# Patient Record
Sex: Female | Born: 1995 | Race: Black or African American | Hispanic: No | Marital: Single | Smoking: Never smoker
Health system: Southern US, Community
[De-identification: ages and names within clinical notes are randomized; demographics above are authoritative.]

## PROBLEM LIST (undated history)

## (undated) ENCOUNTER — Encounter

## (undated) ENCOUNTER — Ambulatory Visit

## (undated) ENCOUNTER — Ambulatory Visit: Payer: MEDICAID | Attending: Dermatology | Primary: Dermatology

## (undated) ENCOUNTER — Telehealth: Attending: Dermatology | Primary: Dermatology

## (undated) ENCOUNTER — Encounter: Attending: Dermatology | Primary: Dermatology

## (undated) ENCOUNTER — Ambulatory Visit: Payer: MEDICAID

## (undated) ENCOUNTER — Telehealth

## (undated) ENCOUNTER — Ambulatory Visit (HOSPITAL_COMMUNITY): Admission: EM

## (undated) ENCOUNTER — Inpatient Hospital Stay (HOSPITAL_COMMUNITY): Payer: Self-pay

## (undated) DIAGNOSIS — T7840XA Allergy, unspecified, initial encounter: Secondary | ICD-10-CM

## (undated) DIAGNOSIS — L309 Dermatitis, unspecified: Secondary | ICD-10-CM

## (undated) DIAGNOSIS — L2084 Intrinsic (allergic) eczema: Secondary | ICD-10-CM

## (undated) HISTORY — DX: Intrinsic (allergic) eczema: L20.84

## (undated) HISTORY — DX: Allergy, unspecified, initial encounter: T78.40XA

## (undated) HISTORY — PX: NO PAST SURGERIES: SHX2092

## (undated) MED ORDER — DUPIXENT 300 MG/2 ML SUBCUTANEOUS PEN INJECTOR: mL | 2 refills | 0 days

---
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
## 2015-06-02 ENCOUNTER — Encounter: Payer: Self-pay | Admitting: *Deleted

## 2015-06-02 ENCOUNTER — Encounter (INDEPENDENT_AMBULATORY_CARE_PROVIDER_SITE_OTHER): Payer: Self-pay

## 2015-06-02 ENCOUNTER — Ambulatory Visit (INDEPENDENT_AMBULATORY_CARE_PROVIDER_SITE_OTHER): Payer: No Typology Code available for payment source | Admitting: *Deleted

## 2015-06-02 VITALS — Ht 61.5 in | Wt 92.8 lb

## 2015-06-02 DIAGNOSIS — N946 Dysmenorrhea, unspecified: Secondary | ICD-10-CM

## 2015-06-02 DIAGNOSIS — L309 Dermatitis, unspecified: Secondary | ICD-10-CM

## 2015-06-02 DIAGNOSIS — Z68.41 Body mass index (BMI) pediatric, less than 5th percentile for age: Secondary | ICD-10-CM

## 2015-06-02 DIAGNOSIS — Z3040 Encounter for surveillance of contraceptives, unspecified: Secondary | ICD-10-CM | POA: Diagnosis not present

## 2015-06-02 DIAGNOSIS — Z3049 Encounter for surveillance of other contraceptives: Secondary | ICD-10-CM

## 2015-06-02 DIAGNOSIS — Z3042 Encounter for surveillance of injectable contraceptive: Secondary | ICD-10-CM

## 2015-06-02 LAB — CBC WITH DIFFERENTIAL/PLATELET
BASOS ABS: 0 10*3/uL (ref 0.0–0.1)
Basophils Relative: 0 % (ref 0–1)
EOS ABS: 0.7 10*3/uL (ref 0.0–0.7)
EOS PCT: 8 % — AB (ref 0–5)
HCT: 33.6 % — ABNORMAL LOW (ref 36.0–46.0)
HEMOGLOBIN: 10.4 g/dL — AB (ref 12.0–15.0)
LYMPHS ABS: 1.6 10*3/uL (ref 0.7–4.0)
Lymphocytes Relative: 17 % (ref 12–46)
MCH: 21.7 pg — ABNORMAL LOW (ref 26.0–34.0)
MCHC: 31 g/dL (ref 30.0–36.0)
MCV: 70.1 fL — AB (ref 78.0–100.0)
MONO ABS: 0.7 10*3/uL (ref 0.1–1.0)
MONOS PCT: 8 % (ref 3–12)
MPV: 9.6 fL (ref 8.6–12.4)
Neutro Abs: 6.2 10*3/uL (ref 1.7–7.7)
Neutrophils Relative %: 67 % (ref 43–77)
PLATELETS: 327 10*3/uL (ref 150–400)
RBC: 4.79 MIL/uL (ref 3.87–5.11)
RDW: 16.6 % — AB (ref 11.5–15.5)
WBC: 9.3 10*3/uL (ref 4.0–10.5)

## 2015-06-02 LAB — POCT URINE PREGNANCY: PREG TEST UR: NEGATIVE

## 2015-06-02 MED ORDER — CLOBETASOL PROPIONATE 0.05 % EX OINT: 1.0000 "application " | TOPICAL_OINTMENT | Freq: Two times a day (BID) | CUTANEOUS | Status: DC

## 2015-06-02 MED ORDER — TRIAMCINOLONE ACETONIDE 0.1 % EX OINT: 1.0000 "application " | TOPICAL_OINTMENT | Freq: Two times a day (BID) | CUTANEOUS | Status: DC

## 2015-06-02 MED ORDER — MEDROXYPROGESTERONE ACETATE 150 MG/ML IM SUSP
150.0000 mg | Freq: Once | INTRAMUSCULAR | Status: AC
  Administered 2015-06-02: 150 mg via INTRAMUSCULAR

## 2015-06-02 NOTE — Progress Notes (Signed)
History was provided by the patient and mother.  Kimberly Odom is a 19 y.o. female who is here for follow up.    HPI:   1.Severe eczema Right now using hydrocortisone cream, eucerin cream, dove soap. Mother reports that symptoms worsen in the summer time. Family was referred to dermatology last visit (one year prior to presentation) and did not attend appointment. Mother reports that lesions are itchy and Kimberly Odom states that she has been scratching her back most prominently. She has not tried bleach baths in the past. She denies open lesions, active drainage or bleeding.  Itchy lesions. Mother reports frustration that only if reminded does [name] apply lotion/ topical steroid. She states that [name] does not like being "greasy."  2. Irregular menstrual cycle/ Menorrhagia / Contraceptive management:  Mother reports that cycles are "out of control". Cycles occur monthly (at different times during the month). Cycles are 8 days in duration. She reports significant pain during the first 2 days. She endorses painful cramps that limit activity. She uses "super" tampons and completely saturates tampon within 2-3 hours of application most days of her cycle. Cycle lightens during last day per [name]. Mother was called from school because she refused to come out of the bathroom due to pain. She also had an episode of fainting at a department store while on her period. She denies changes in heart rate or chest pain during this episode.   Started depot at last appointment ( 1 year prior to presentation). Mother reports 2 administration of depot shot since that time. She is not currently using any regimen for contraception management. Mother and Kimberly Odom are interested in long-term management as OCP's were not effective. Mother wonders about nexplanon, but is not interested in intrauterine device. Per mom, it was difficult to keep up when the next shot was due.   3. Weight Weight has stablized at the  appointment. Mom reports that weight is always challenging during eczema flares. Eating 3 meals daily, fried chicken, balanced meals. Fruits/ veggies. Never got prescription for pediasure, mom is interested in this. Mom is not very interested in meeting with nutritionist.    4. Mood [name] reports that mood is stable. Mother has no concerns. Kimberly Odom denies frequent crying, difficulty sleeping. She will enter 10th grade this year. Reports that grades are "okay" and needs to work on [place].   Physical Exam:  Ht 5' 1.5" (1.562 m)  Wt 92 lb 12.8 oz (42.094 kg)  BMI 17.25 kg/m2  LMP 06/02/2015  No blood pressure reading on file for this encounter. Patient's last menstrual period was 06/02/2015.  General:   alert and cooperative. Thin adolescent female. Sitting upright on examination table. Quiet and soft spoken throughout the encounter. In no acute distress.   Skin:   Severe ecezma diffusely to skin. Some areas of thickened plaques most prominent to dorsum of feet bilaterally. No active superimposed infection or drainage from lesions.   Oral cavity:   lips, mucosa, and tongue normal; teeth and gums normal  Eyes:   sclerae white, pupils equal and reactive, red reflex normal bilaterally  Ears:   normal bilaterally  Nose: clear, no discharge  Neck:  Neck appearance: Normal  Lungs:  clear to auscultation bilaterally  Heart:   regular rate and rhythm, S1, S2 normal, no murmur, click, rub or gallop   Abdomen:  soft, non-tender; bowel sounds normal; no masses,  no organomegaly  GU:  normal female  Extremities:   extremities normal, atraumatic, no cyanosis or edema  Neuro:  normal without focal findings and mental status, speech normal, alert and oriented    Assessment/Plan: 1. Severe eczema Emphasized importance of daily maintenance care for management of eczema. Recommended calendar to remind Kimberly Odom to administered lotion and ointment daily. Mother interested in referral to Dermatology, but  did not attend next year. Eczema flares during the summer time. Will write for clobetasol for acute flare, counseled to use within the next 5 days. Recommended transition to daily triamcinolone afterwards.  - Ambulatory referral to Dermatology - clobetasol ointment (TEMOVATE) 0.05 %; Apply 1 application topically 2 (two) times daily. For very severe eczema.  Do not use for more than 1 week at a time.  Dispense: 60 g; Refill: 1 - triamcinolone ointment (KENALOG) 0.1 %; Apply 1 application topically 2 (two) times daily.  Dispense: 453.6 g; Refill: 4  2. Dysmenorrhea in adolescent Will obtain CBC with differential for assessment of Hgb status in setting of dysmenorrhea/ menorrhagia. Will write for Depo injection at this visit as detailed below. If patient with further episodes of fainting can consider cardiology work up.  - CBC with Differential/Platelet  3. Encounter for surveillance of contraceptives, Depo contraception Counseled mother and patient that Depo contraception often requires more than two administrations prior to regulation of cycle and improvement in menorrhagia. Counseled that Depo would be ideal contraception management due to promotion of weight gain. Will obtain U-preg and if negative, administer Depo-provera at this appointment. If depot does not improve symptoms after taking consistently, counseled family to consider nexplanon.  - POCT urine pregnancy-- negative  - medroxyPROGESTERone (DEPO-PROVERA) injection 150 mg; Inject 1 mL (150 mg total) into the muscle once.  4. BMI (body mass index), pediatric, less than 5th percentile for age -Amb referral to nutrition. Counseled regarding eating healthy nutritious meals.   5. Transition of care:  Patient now 19 years of age, will likely require transition to adult provider in upcoming year. Patient remains in highschool secondary to school failure.   Mom expressed understanding and agreement with plan.   - Follow-up visit in 3 months  for [facility] and depo administration, or sooner as needed.    Elige Radon, MD [facility] Pediatric Primary Care PGY-2 06/02/2015

## 2015-06-02 NOTE — Patient Instructions (Signed)
MAKE AN ALARM ON YOUR PHONE FOR THE NEXT DEPOT SHOT (Birth control shot)  APPOINTMENT.     Basic Skin Care Your child's skin plays an important role in keeping the entire body healthy.  Below are some tips on how to try and maximize skin health from the outside in.  1) Bathe in mildly warm water every 1 to 3 days, followed by light drying and an application of a thick moisturizer cream or ointment, preferably one that comes in a tub. a. Fragrance free moisturizing bars or body washes are preferred such as Purpose, Cetaphil, Dove sensitive skin, Aveeno, [employer] or Vanicream products. b. Use a fragrance free cream or ointment, not a lotion, such as plain petroleum jelly or Vaseline ointment, Aquaphor, Vanicream, Eucerin cream or a generic version, CeraVe Cream, Cetaphil Restoraderm, Aveeno Eczema Therapy and [employer], among others. c. Children with very dry skin often need to put on these creams two, three or four times a day.  As much as possible, use these creams enough to keep the skin from looking dry. d. Consider using fragrance free/dye free detergent, such as Arm and Hammer for sensitive skin, Tide Free or All Free.   2) If I am prescribing a medication to go on the skin, the medicine goes on first to the areas that need it, followed by a thick cream as above to the entire body.  3) Wynelle Link is a major cause of damage to the skin. a. I recommend sun protection for all of my patients. I prefer physical barriers such as hats with wide brims that cover the ears, long sleeve clothing with SPF protection including rash guards for swimming. These can be found seasonally at outdoor clothing companies, [employer] and [employer] and online at Liz Cla[employer]orne.com, www.uvskinz.com and BrideEmporium.nl. Avoid peak sun between the hours of 10am to 3pm to minimize sun exposure.  b. I recommend sunscreen for all of my patients older than 40 months of age when in the sun, preferably with broad  spectrum coverage and SPF 30 or higher.  i. For children, I recommend sunscreens that only contain titanium dioxide and/or zinc oxide in the active ingredients. These do not burn the eyes and appear to be safer than chemical sunscreens. These sunscreens include zinc oxide paste found in the diaper section, Vanicream Broad Spectrum 50+, Aveeno Natural Mineral Protection, Neutrogena Pure and Free Baby, [employer] Daily face and body lotion, [employer], among others. ii. There is no such thing as waterproof sunscreen. All sunscreens should be reapplied after 60-80 minutes of wear.  iii. Spray on sunscreens often use chemical sunscreens which do protect against the sun. However, these can be difficult to apply correctly, especially if wind is present, and can be more likely to irritate the skin.  Long term effects of chemical sunscreens are also not fully known.

## 2015-06-02 NOTE — Progress Notes (Signed)
I saw and evaluated the patient, performing the key elements of the service. I developed the management plan that is described in the resident's note, and I agree with the content.  Crystle Carelli                  06/02/2015, 3:01 PM

## 2015-06-03 ENCOUNTER — Encounter: Payer: Self-pay | Admitting: Pediatrics

## 2015-06-03 DIAGNOSIS — D509 Iron deficiency anemia, unspecified: Secondary | ICD-10-CM | POA: Insufficient documentation

## 2015-06-05 ENCOUNTER — Telehealth: Payer: Self-pay

## 2015-06-05 NOTE — Telephone Encounter (Signed)
-----   Message from Theadore Nan, MD sent at 06/03/2015  9:39 AM EDT ----- Please let mom know that she is a little anemic and should be taking a multivitamin with iron. We can recheck her level at her next visit for Depo in October

## 2015-06-05 NOTE — Telephone Encounter (Signed)
Message given to mother, no further questions.

## 2015-08-29 ENCOUNTER — Ambulatory Visit: Payer: No Typology Code available for payment source | Admitting: Pediatrics

## 2015-09-01 ENCOUNTER — Ambulatory Visit (INDEPENDENT_AMBULATORY_CARE_PROVIDER_SITE_OTHER): Payer: No Typology Code available for payment source | Admitting: Pediatrics

## 2015-09-01 ENCOUNTER — Encounter: Payer: Self-pay | Admitting: Pediatrics

## 2015-09-01 VITALS — Wt 95.0 lb

## 2015-09-01 DIAGNOSIS — L309 Dermatitis, unspecified: Secondary | ICD-10-CM | POA: Diagnosis not present

## 2015-09-01 DIAGNOSIS — Z3042 Encounter for surveillance of injectable contraceptive: Secondary | ICD-10-CM | POA: Diagnosis not present

## 2015-09-01 DIAGNOSIS — N926 Irregular menstruation, unspecified: Secondary | ICD-10-CM | POA: Diagnosis not present

## 2015-09-01 DIAGNOSIS — Z23 Encounter for immunization: Secondary | ICD-10-CM | POA: Diagnosis not present

## 2015-09-01 MED ORDER — CLOBETASOL PROPIONATE 0.05 % EX OINT: 1.0000 "application " | TOPICAL_OINTMENT | Freq: Two times a day (BID) | CUTANEOUS | Status: DC

## 2015-09-01 MED ORDER — TRIAMCINOLONE ACETONIDE 0.1 % EX OINT: 1.0000 "application " | TOPICAL_OINTMENT | Freq: Two times a day (BID) | CUTANEOUS | Status: DC

## 2015-09-01 MED ORDER — MEDROXYPROGESTERONE ACETATE 150 MG/ML IM SUSP
150.0000 mg | Freq: Once | INTRAMUSCULAR | Status: AC
  Administered 2015-09-01: 150 mg via INTRAMUSCULAR

## 2015-09-01 NOTE — Progress Notes (Signed)
   Subjective:     Kimberly Odom, is a 19 y.o. female  HPI  Depo--first was 06/02/15 For heavy and painful bleeding  Currently having menses and this is second time since depo  Now 2 1/2 weeks: now spotting for a couple days, when first started bleeding was lighter than before, no pian so far.  Diet: not milk every day, no vitamins.  Atopic Derm Has appt on 09/07/15: for first visit with derm Skin is not so good right now because ran out of refills Clobetasol--on bad area Still has big tub of triamcinalone Needs refills Dove every two day, vaseline twice a day  Twice     Review of Systems  Sister recently had heart transplant.   The following portions of the patient's history were reviewed and updated as appropriate: allergies, current medications, past family history, past medical history, past social history, past surgical history and problem list.     Objective:     Physical Exam  Skin: hyperpigmentation and dry everywhere, but less thick plaques than last visit. No scab no open areas.     Assessment & Plan:   1. Irregular menstrual cycle Reviewed calcium and vit D needed (and iron) Improved pain and decreased flow. Reassured than flow and pain will continue to decrease as uses depo longer.  - medroxyPROGESTERone (DEPO-PROVERA) injection 150 mg; Inject 1 mL (150 mg total) into the muscle once.  2. Severe eczema All areas involved, but better than last visit, reviewed care and refills ordered.   - clobetasol ointment (TEMOVATE) 0.05 %; Apply 1 application topically 2 (two) times daily. For very severe eczema.  Do not use for more than 1 week at a time.  Dispense: 60 g; Refill: 1 - triamcinolone ointment (KENALOG) 0.1 %; Apply 1 application topically 2 (two) times daily.  Dispense: 453.6 g; Refill: 4  3. Need for vaccination  - Flu Vaccine QUAD 36+ mos IM  Spent 15 minutes face to face time with patient; greater than 50% spent in counseling regarding  diagnosis and treatment plan.   Theadore NanMCCORMICK, Rishita Petron, MD

## 2015-09-01 NOTE — Patient Instructions (Signed)
Calcium:  Needs between 800 and 1500 mg of calcium a day with Vitamin D Try:  Viactiv two a day Or extra strength Tums 500 mg twice a day Or orange juice with calcium.  Calcium Carbonate 500 mg  Twice a day   Also need Vitamin D

## 2015-11-17 ENCOUNTER — Ambulatory Visit: Payer: No Typology Code available for payment source | Admitting: *Deleted

## 2015-11-22 ENCOUNTER — Ambulatory Visit: Payer: Self-pay | Admitting: *Deleted

## 2015-12-12 ENCOUNTER — Ambulatory Visit (INDEPENDENT_AMBULATORY_CARE_PROVIDER_SITE_OTHER): Payer: Self-pay | Admitting: *Deleted

## 2015-12-12 DIAGNOSIS — Z3042 Encounter for surveillance of injectable contraceptive: Secondary | ICD-10-CM

## 2015-12-12 DIAGNOSIS — Z3202 Encounter for pregnancy test, result negative: Secondary | ICD-10-CM

## 2015-12-12 DIAGNOSIS — Z3049 Encounter for surveillance of other contraceptives: Secondary | ICD-10-CM

## 2015-12-12 MED ORDER — MEDROXYPROGESTERONE ACETATE 150 MG/ML IM SUSP
150.0000 mg | Freq: Once | INTRAMUSCULAR | Status: AC
  Administered 2015-12-12: 150 mg via INTRAMUSCULAR

## 2016-02-22 ENCOUNTER — Other Ambulatory Visit: Payer: Self-pay | Admitting: Pediatrics

## 2016-02-22 ENCOUNTER — Telehealth: Payer: Self-pay | Admitting: *Deleted

## 2016-02-22 DIAGNOSIS — L309 Dermatitis, unspecified: Secondary | ICD-10-CM

## 2016-02-22 MED ORDER — CLOBETASOL PROPIONATE 0.05 % EX OINT: 1.0000 "application " | TOPICAL_OINTMENT | Freq: Two times a day (BID) | CUTANEOUS | Status: DC

## 2016-02-22 NOTE — Telephone Encounter (Signed)
done

## 2016-02-22 NOTE — Telephone Encounter (Signed)
Called mom and let her know

## 2016-02-22 NOTE — Telephone Encounter (Signed)
Mom requesting a refill on Clobetasol ointment.

## 2016-02-27 ENCOUNTER — Ambulatory Visit (INDEPENDENT_AMBULATORY_CARE_PROVIDER_SITE_OTHER): Payer: Self-pay | Admitting: *Deleted

## 2016-02-27 DIAGNOSIS — Z3049 Encounter for surveillance of other contraceptives: Secondary | ICD-10-CM

## 2016-02-27 DIAGNOSIS — Z3042 Encounter for surveillance of injectable contraceptive: Secondary | ICD-10-CM

## 2016-02-27 MED ORDER — MEDROXYPROGESTERONE ACETATE 150 MG/ML IM SUSP
150.0000 mg | Freq: Once | INTRAMUSCULAR | Status: AC
  Administered 2016-02-27: 150 mg via INTRAMUSCULAR

## 2016-02-27 NOTE — Progress Notes (Signed)
Pt presents for depo injection. Pt within depo window, no urine hcg needed. Injection given, tolerated well. F/u depo injection visit scheduled.   

## 2016-02-28 ENCOUNTER — Telehealth: Payer: Self-pay | Admitting: Pediatrics

## 2016-02-28 NOTE — Telephone Encounter (Signed)
Stopped by mother yesterday evening as I was walking out of clinic. Mother states her daughter was seen and asked for a prescription for her eczema cream.  Mother states daughter was told she could not get that prescription at the visit.  Mother states patient's medicaid expired and she is in the process of renewing it.  Her eczema cream costs $400 without insurance.  Therefore, mother would like alternative cream to use while the paperwork for medicaid reapplication is processed.  Advised mother that clinic was closed by that I would share the message with her PCP, Dr. Kathlene NovemberMcCormick.  Of note, it appears patient was in for Depoprovera only visit when requesting eczema cream.

## 2016-03-12 ENCOUNTER — Encounter: Payer: Self-pay | Admitting: Pediatrics

## 2016-03-12 ENCOUNTER — Ambulatory Visit (INDEPENDENT_AMBULATORY_CARE_PROVIDER_SITE_OTHER): Payer: Self-pay | Admitting: Clinical

## 2016-03-12 ENCOUNTER — Ambulatory Visit (INDEPENDENT_AMBULATORY_CARE_PROVIDER_SITE_OTHER): Payer: Self-pay | Admitting: Pediatrics

## 2016-03-12 VITALS — BP 102/64 | Ht 61.22 in | Wt 95.1 lb

## 2016-03-12 DIAGNOSIS — Z113 Encounter for screening for infections with a predominantly sexual mode of transmission: Secondary | ICD-10-CM

## 2016-03-12 DIAGNOSIS — R4183 Borderline intellectual functioning: Secondary | ICD-10-CM

## 2016-03-12 DIAGNOSIS — L309 Dermatitis, unspecified: Secondary | ICD-10-CM

## 2016-03-12 DIAGNOSIS — R69 Illness, unspecified: Secondary | ICD-10-CM

## 2016-03-12 MED ORDER — TRIAMCINOLONE ACETONIDE 0.5 % EX OINT: 1.0000 "application " | TOPICAL_OINTMENT | Freq: Two times a day (BID) | CUTANEOUS | Status: DC

## 2016-03-12 MED ORDER — TRIAMCINOLONE ACETONIDE 0.1 % EX OINT: 1.0000 "application " | TOPICAL_OINTMENT | Freq: Two times a day (BID) | CUTANEOUS | Status: DC

## 2016-03-12 NOTE — Progress Notes (Signed)
   Subjective:     Kimberly Odom, is a 20 y.o. female  No chief complaint on file.    HPI  Mom is very worried about her future.   11 th grade, should graduate at 20 year old  in December,  Really struggles with learning, basic knowledge, seems like a 20 year old  Had IEP, is capable of learning, borderline intellectual ability, low achievement Getting Ds IEP completed in 10 grade, not special E eligible, in regular classess  Struggle to interview for job,  Is scared to learn to drive,  Reads some but little comprehension of what read,   She took pictures of herself without clothes and sent on internet  Guardian none established  Just once in a while ensure, mostly eats good.   Skin: ran out of cream, no longer had medicaid, cost $400  Review of Systems    The following portions of the patient's history were reviewed and updated as appropriate: allergies, current medications, past family history, past medical history, past social history, past surgical history and problem list.     Objective:     There were no vitals taken for this visit.  Physical Exam  Constitutional: She appears well-developed.  HENT:  Mouth/Throat: Oropharynx is clear and moist.  Cardiovascular: Normal rate.   No murmur heard. Pulmonary/Chest: Effort normal and breath sounds normal.  Abdominal: Soft. She exhibits no distension. There is no tenderness.  Lymphadenopathy:    She has no cervical adenopathy.  Skin: Rash noted.  Extensive thick plaques on hands.        Assessment & Plan:   1. Borderline intellectual disability Needs Guardianship: Protecting her physically and  financially Supervised job setting,  Apply for [employer] does some life skills training ,  Medical transition to adult provider ,  Wilfred LacyJ Williams, also to see, will also preovide Legal aid references and women's center resource.   2. Severe eczema  Discussed 4$ prescriptions,   - triamcinolone ointment  (KENALOG) 0.5 %; Apply 1 application topically 2 (two) times daily.  Dispense: 45 g; Refill: 1 - triamcinolone ointment (KENALOG) 0.1 %; Apply 1 application topically 2 (two) times daily.  Dispense: 80 g; Refill: 4  3. Routine screening for STI (sexually transmitted infection)  - GC/Chlamydia Probe Amp  Patient and/or legal guardian verbally consented to meet with Behavioral Health Clinician about presenting concerns. Supportive care and return precautions reviewed.  Spent  25  minutes face to face time with patient; greater than 50% spent in counseling regarding diagnosis and treatment plan.   Theadore NanMCCORMICK, Kimberly Franta, MD

## 2016-03-13 LAB — GC/CHLAMYDIA PROBE AMP
CT Probe RNA: NOT DETECTED
GC Probe RNA: NOT DETECTED

## 2016-03-14 NOTE — BH Specialist Note (Signed)
VISIT DATE: 03/12/16 Referring Provider: Theadore NanMCCORMICK, HILARY, MD Session Time:  1215 - 1235 (20 minutes) Type of Service: Behavioral Health - Individual/Family Interpreter: No.  Interpreter Name & Language: N/A # [facility] July 2016-June 2017: 1ST  PRESENTING CONCERNS:  Matthias HughsQuesheba Goosby is a 20 y.o. female brought in by mother. Matthias HughsQuesheba Ambers [facility] concerns with behaviors and need for additional support system.  Mother concerned that Sharlyne CaiQuesheba is not able make healthy & appropriate choices due to her cognitive ability.   GOALS ADDRESSED:  Increase adequate support system to improve ability to make healthier choices.   INTERVENTIONS:  Introduced [facility] role within integrated care team Provided education on resources & support system in the community, including resources for guardianship as requested by mother.   ASSESSMENT/OUTCOME:  Sharlyne CaiQuesheba presented to be quiet through most of the visit but did respond to direct questions.  Sharlyne CaiQuesheba and her mother were given education and resources in the community, including opportunity for [employer] training and information about guardianship.   TREATMENT PLAN:  Mother to review information and follow up with community resources. Sharlyne CaiQuesheba to participate in decision making regarding her involvement with services in the community.   No follow up visit scheduled with this [facility] at this time.  This [facility] will be available as needed for additional support & resources as needed.  Jasmine P Bettey CostaWilliams LCSW Behavioral Health Clinician [facility]

## 2016-05-14 ENCOUNTER — Ambulatory Visit (INDEPENDENT_AMBULATORY_CARE_PROVIDER_SITE_OTHER): Payer: Self-pay | Admitting: *Deleted

## 2016-05-14 DIAGNOSIS — Z3049 Encounter for surveillance of other contraceptives: Secondary | ICD-10-CM

## 2016-05-14 DIAGNOSIS — Z3042 Encounter for surveillance of injectable contraceptive: Secondary | ICD-10-CM

## 2016-05-14 MED ORDER — MEDROXYPROGESTERONE ACETATE 150 MG/ML IM SUSP
150.0000 mg | Freq: Once | INTRAMUSCULAR | Status: AC
  Administered 2016-05-14: 150 mg via INTRAMUSCULAR

## 2016-05-14 NOTE — Progress Notes (Signed)
Pt presents for depo injection. Pt within depo window, no urine hcg needed. Injection given, tolerated well. F/u depo injection visit scheduled.   

## 2016-05-29 ENCOUNTER — Encounter: Payer: Self-pay | Admitting: Pediatrics

## 2016-05-30 ENCOUNTER — Encounter: Payer: Self-pay | Admitting: Pediatrics

## 2016-07-30 ENCOUNTER — Encounter: Payer: Self-pay | Admitting: *Deleted

## 2016-07-30 ENCOUNTER — Ambulatory Visit (INDEPENDENT_AMBULATORY_CARE_PROVIDER_SITE_OTHER): Payer: Self-pay | Admitting: *Deleted

## 2016-07-30 DIAGNOSIS — Z3042 Encounter for surveillance of injectable contraceptive: Secondary | ICD-10-CM

## 2016-07-30 DIAGNOSIS — Z3049 Encounter for surveillance of other contraceptives: Secondary | ICD-10-CM

## 2016-07-30 MED ORDER — MEDROXYPROGESTERONE ACETATE 150 MG/ML IM SUSP
150.0000 mg | Freq: Once | INTRAMUSCULAR | Status: AC
  Administered 2016-07-30: 150 mg via INTRAMUSCULAR

## 2016-07-30 NOTE — Progress Notes (Signed)
Pt presents for depo injection. Pt within depo window, no urine hcg needed. Injection given, tolerated well. F/u depo injection visit scheduled.   

## 2016-10-15 ENCOUNTER — Ambulatory Visit (INDEPENDENT_AMBULATORY_CARE_PROVIDER_SITE_OTHER): Payer: Self-pay | Admitting: *Deleted

## 2016-10-15 DIAGNOSIS — Z3042 Encounter for surveillance of injectable contraceptive: Secondary | ICD-10-CM

## 2016-10-15 MED ORDER — MEDROXYPROGESTERONE ACETATE 150 MG/ML IM SUSP
150.0000 mg | Freq: Once | INTRAMUSCULAR | Status: AC
  Administered 2016-10-15: 150 mg via INTRAMUSCULAR

## 2016-10-15 NOTE — Progress Notes (Signed)
Pt presents for depo injection. Pt within depo window, no urine hcg needed. Injection given, tolerated well. F/u depo injection visit scheduled.   

## 2016-12-31 ENCOUNTER — Ambulatory Visit (INDEPENDENT_AMBULATORY_CARE_PROVIDER_SITE_OTHER): Payer: Self-pay | Admitting: *Deleted

## 2016-12-31 DIAGNOSIS — Z3049 Encounter for surveillance of other contraceptives: Secondary | ICD-10-CM

## 2016-12-31 MED ORDER — MEDROXYPROGESTERONE ACETATE 150 MG/ML IM SUSP
150.0000 mg | Freq: Once | INTRAMUSCULAR | Status: AC
  Administered 2016-12-31: 150 mg via INTRAMUSCULAR

## 2016-12-31 NOTE — Progress Notes (Signed)
Patient within Depo window. Presents today for her depo shot. IM given in left deltoid tolerated well, and scheduled an appointment for May 15th for another Depo. Patient discharged.

## 2017-03-02 ENCOUNTER — Other Ambulatory Visit: Payer: Self-pay | Admitting: Pediatrics

## 2017-03-02 DIAGNOSIS — L309 Dermatitis, unspecified: Secondary | ICD-10-CM

## 2017-03-03 NOTE — Telephone Encounter (Signed)
Refill for Triamcinalone  ReceivedShe had not been seen for this problem for a year.    Please make an appointment for evaluation of eczema.  Kimberly Odom is almost 21 and will soon no longer be able to be seen in the Pediatric clinic.   I have heard that  [facility] is taking New Patients. Please request and return a new patient appt in order to schedule an appointment there if you would like.

## 2017-03-18 ENCOUNTER — Ambulatory Visit (INDEPENDENT_AMBULATORY_CARE_PROVIDER_SITE_OTHER): Payer: Self-pay | Admitting: *Deleted

## 2017-03-18 DIAGNOSIS — Z3042 Encounter for surveillance of injectable contraceptive: Secondary | ICD-10-CM

## 2017-03-18 DIAGNOSIS — Z113 Encounter for screening for infections with a predominantly sexual mode of transmission: Secondary | ICD-10-CM

## 2017-03-18 MED ORDER — MEDROXYPROGESTERONE ACETATE 150 MG/ML IM SUSP
150.0000 mg | Freq: Once | INTRAMUSCULAR | Status: AC
  Administered 2017-03-18: 150 mg via INTRAMUSCULAR

## 2017-03-18 NOTE — Progress Notes (Signed)
Patient within Depo window. Presents today for her depo shot. IM given in left deltoid tolerated well,  Pt stated that she has being bleeing on and off for the last month. Consulted with Alfonso Ramusaroline Hacker, Urine sample obtained for STI testing, advised pt to call us back to schedule appointment with provider if this continue to happen. Scheduled an appointment for July 30 th one day herlier per caorline's for another Depo. Patient discharged.

## 2017-03-19 LAB — GC/CHLAMYDIA PROBE AMP
CT PROBE, AMP APTIMA: NOT DETECTED
GC PROBE AMP APTIMA: NOT DETECTED

## 2017-03-25 NOTE — Progress Notes (Signed)
STI screen negative  Spoke to patient and she is aware of neg STI results.

## 2017-05-09 ENCOUNTER — Telehealth: Payer: Self-pay | Admitting: *Deleted

## 2017-05-09 NOTE — Telephone Encounter (Signed)
Mom called requesting refill for triamcinolone cream. Since patient has not been seen for this condition for over a year I attempted to call the family to schedule. Left voicemail asking them to call the clinic.

## 2017-05-14 NOTE — Telephone Encounter (Signed)
Agree with need to been seen. Is also past due for well care.  Kimberly Odom needs to transition to a medical provider for adults. Mom may have a preferred new clinic. [facility] often is taking new patients.

## 2017-06-02 ENCOUNTER — Ambulatory Visit (INDEPENDENT_AMBULATORY_CARE_PROVIDER_SITE_OTHER): Payer: Self-pay | Admitting: *Deleted

## 2017-06-02 DIAGNOSIS — Z3042 Encounter for surveillance of injectable contraceptive: Secondary | ICD-10-CM

## 2017-06-02 MED ORDER — MEDROXYPROGESTERONE ACETATE 150 MG/ML IM SUSP
150.0000 mg | Freq: Once | INTRAMUSCULAR | Status: AC
  Administered 2017-06-02: 150 mg via INTRAMUSCULAR

## 2017-06-02 NOTE — Addendum Note (Signed)
Addended by: Theadore NanMCCORMICK, Bronnie Vasseur on: 06/02/2017 11:02 AM   Modules accepted: Orders

## 2017-06-02 NOTE — Progress Notes (Signed)
Agree with planned assessment by adolescent team for IUD, will place order.  I also note that she is past due for periodic review of reporductive health issues with adolescent provider.

## 2017-06-02 NOTE — Progress Notes (Signed)
Patient within Depo window. Presents today for her depo shot. IM given in left deltoid tolerated well. Patient voiced and interest in IUD.  Will route this encounter to PCP for referral to Adolescent clinic for IUD.

## 2017-08-08 ENCOUNTER — Encounter: Payer: Self-pay | Admitting: Pediatrics

## 2017-08-22 ENCOUNTER — Ambulatory Visit: Payer: Self-pay | Admitting: Pediatrics

## 2017-08-26 ENCOUNTER — Ambulatory Visit: Payer: Self-pay | Admitting: Pediatrics

## 2017-08-28 ENCOUNTER — Ambulatory Visit (INDEPENDENT_AMBULATORY_CARE_PROVIDER_SITE_OTHER): Payer: Self-pay | Admitting: Pediatrics

## 2017-08-28 ENCOUNTER — Encounter: Payer: Self-pay | Admitting: Pediatrics

## 2017-08-28 VITALS — BP 108/73 | Ht 60.24 in | Wt 93.0 lb

## 2017-08-28 DIAGNOSIS — B070 Plantar wart: Secondary | ICD-10-CM

## 2017-08-28 DIAGNOSIS — Z3042 Encounter for surveillance of injectable contraceptive: Secondary | ICD-10-CM

## 2017-08-28 DIAGNOSIS — Z113 Encounter for screening for infections with a predominantly sexual mode of transmission: Secondary | ICD-10-CM

## 2017-08-28 DIAGNOSIS — Z23 Encounter for immunization: Secondary | ICD-10-CM

## 2017-08-28 DIAGNOSIS — L309 Dermatitis, unspecified: Secondary | ICD-10-CM

## 2017-08-28 DIAGNOSIS — L2089 Other atopic dermatitis: Secondary | ICD-10-CM

## 2017-08-28 LAB — POCT URINE PREGNANCY: Preg Test, Ur: NEGATIVE

## 2017-08-28 LAB — POCT RAPID HIV: Rapid HIV, POC: NEGATIVE

## 2017-08-28 MED ORDER — MEDROXYPROGESTERONE ACETATE 150 MG/ML IM SUSP
150.0000 mg | Freq: Once | INTRAMUSCULAR | Status: AC
  Administered 2017-08-28: 150 mg via INTRAMUSCULAR

## 2017-08-28 MED ORDER — TRIAMCINOLONE ACETONIDE 0.5 % EX OINT: 1.0000 "application " | TOPICAL_OINTMENT | Freq: Two times a day (BID) | CUTANEOUS | 1 refills | Status: DC

## 2017-08-28 MED ORDER — TRIAMCINOLONE ACETONIDE 0.1 % EX OINT: 1.0000 "application " | TOPICAL_OINTMENT | Freq: Two times a day (BID) | CUTANEOUS | 4 refills | Status: DC

## 2017-08-28 MED ORDER — CEPHALEXIN 500 MG PO CAPS: 500.0000 mg | ORAL_CAPSULE | Freq: Three times a day (TID) | ORAL | 0 refills | Status: AC

## 2017-08-28 NOTE — Progress Notes (Signed)
Subjective:     Kimberly Odom, is a 21 y.o. female  HPI  Chief Complaint  Patient presents with  . 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     Objective:     Blood pressure 108/73, height 5' 0.24" (1.53 m), weight 93 lb (42.2 kg), last menstrual period 08/25/2017.  Physical Exam  Constitutional: She appears well-nourished. No distress.  Cheerful, friendly, here by self  HENT:  Head: Normocephalic and atraumatic.  Left Ear: External ear normal.  Nose: Nose normal.  Mouth/Throat: Oropharynx is clear and moist.  Eyes: Conjunctivae are normal.  Neck: Normal range of motion.  Cardiovascular: Normal rate, regular rhythm and normal heart sounds.   Pulmonary/Chest: No respiratory distress. She has no wheezes. She has no rales.  Abdominal: Soft. She exhibits no distension. There is no tenderness.  Lymphadenopathy:    She has no cervical adenopathy.  Skin: Skin is warm and dry. Rash noted.  ALL of skin except face involved, thick lichenification, moderate scabs and oozing,  Right sole with three small papules       Assessment & Plan:   1. Severe eczema  With secondary  bacterial infection likely with scabs and oozing, She an I agree that this is not the worse that it has been, but is still very severe  I cannot tell if only wart of foot or if possible scabies with 2 papules on left palm, describes as pain more than itch. But consider treatment for scabies if not resolved I am also concerned that permethrin would further exacerbate her outbreak.  Also , her eczema is so severe that she should have had food allergies assessed at some time, but there are no results in our chart  - triamcinolone ointment (KENALOG) 0.5 %; Apply 1 application topically 2 (two) times daily.  Dispense: 45 g; Refill: 1 - triamcinolone ointment (KENALOG) 0.1 %; Apply 1 application topically 2 (two) times daily.  Dispense: 80 g; Refill: 4  2. Routine screening for STI (sexually transmitted infection) - POCT Rapid HIV - C. trachomatis/N. gonorrhoeae RNA - POCT urine pregnancy  3. Need for immunization against influenza Unable --too old [facility]  4. Encounter for surveillance of injectable contraceptive  Past due for routine follow up with Adolescent team-please make and appt  - medroxyPROGESTERone (DEPO-PROVERA) injection 150 mg; Inject 1 mL (150 mg total) into the muscle once.  5. Flexural atopic dermatitis For bacterial infection:  - cephALEXin (KEFLEX) 500 MG capsule; Take 1 capsule (500 mg total) by mouth 3 (three) times daily.  Dispense: 30 capsule; Refill: 0  6. Plantar wart Trial  of OTC wart treatment first   Supportive care and return precautions reviewed.  Spent  25  minutes face to face time with patient; greater than 50% spent in counseling regarding diagnosis and treatment plan.   Theadore Nan, MD

## 2017-08-28 NOTE — Patient Instructions (Signed)
Please try treating your foot bumps with over the counter wart treatment for 3-4 weeks.  If the spread or get worse, please return for re-evaluation  Please make an appointment for the Adolescent team for routine follow up, It is part your routine appointment time  Please make an appointment for financial counseling. It can be used at all of [facility].  Please transfer [facility] to [facility] or [facility], you are an adult now!

## 2017-08-29 LAB — C. TRACHOMATIS/N. GONORRHOEAE RNA
C. TRACHOMATIS RNA, TMA: NOT DETECTED
N. GONORRHOEAE RNA, TMA: NOT DETECTED

## 2017-09-02 ENCOUNTER — Ambulatory Visit: Payer: Self-pay

## 2017-09-11 ENCOUNTER — Ambulatory Visit: Payer: Self-pay | Admitting: Pediatrics

## 2018-10-12 ENCOUNTER — Other Ambulatory Visit: Payer: Self-pay | Admitting: Pediatrics

## 2018-10-12 DIAGNOSIS — L309 Dermatitis, unspecified: Secondary | ICD-10-CM

## 2018-10-12 NOTE — Telephone Encounter (Signed)
Kimberly DareKing called and wanted a RX refill for Triamcinolone Acetonide. She can be reached at [phone]

## 2018-10-12 NOTE — Telephone Encounter (Signed)
I called number on file [phone] and message said "you have reached the office of Kimberly Odom"; I did not leave message. Letter generated in Epic and mailed to home address on file recommending that Kimberly Odom seek care at an adult clinic.

## 2018-10-12 NOTE — Telephone Encounter (Signed)
I spoke with Kimberly Odom and relayed message from Dr. Kathlene NovemberMcCormick: we cannot write new RX because her last [facility] visit was over one year ago; in addition, we cannot [facility] since she is now over 22 years of age. Many of our patients [facility]. Kimberly Odom [place] in [place], [place] not in school where there is student health center.  I recommended urgen care center or [facility] if she cannot find adult care provider in [place].

## 2018-10-12 NOTE — Telephone Encounter (Signed)
Refill request for TAC 0.5 %. Kimberly Odom has hard to manage eczema.  She is now 22 years old and needs to find an adult clinic.   Also she has not been seen in clinic here for more than one year so we could not refill it since it has been more than one year.

## 2018-11-04 NOTE — L&D Delivery Note (Addendum)
Delivery Note   Patient Name: Kimberly Odom DOB: Nov 06, 1995 MRN: [id number]  Date of admission: 05/02/2019 Delivering MD: Noralyn Pick  Date of delivery:05/02/19 Type of delivery: SVD  Newborn Data: Live born female  Birth Weight:   APGAR: 40, 47  Newborn Delivery   Birth date/time: [birth year]/2019 18:48:00 Delivery type: Vaginal, Spontaneous     Kimberly Odom, 23 y.o., @ [redacted]w[redacted]d,  G1P0, who was admitted for spontaneous labor.Pt was cxt and had been cxt since this morning, but 2 hours ago got a little worse, pt was still able to talk through her cxt on the phone, she called back one hour later and was not able to talk, her grandmother stated she was starting to have some red bleeding, I informed her to come to the hospital asap, eta was ten min, on arrival I was in the mau and assessed, her there was light amount of frank red blood and she was found to be complete, I helped transfer her to LD she was intact and felt the need to push, progressed +1 station in the second stage of labor. She began to push, but was a poor pusher, Dr Mancel Bale was called and updated on her status, she continued to push and with each cxt I noted a smal gush of bright red blood, I arom'ed her and was clear fluid to help her pushing efforts, fetus went from a Cat 1 to a cat 2 with prolonged decel during pushing, I called Dr Mancel Bale back and requested a bedside consult, Dr Mancel Bale was in route to the hospital. I continued to push with the pt and she pushed for She pushed for 30/min.  She delivered a viable infant, cephalic and restituted to the ROA position over an intact perineum.  A nuchal cord   was identified, and reduced. Dr Mancel Bale was called by the RN and informed the baby was born and stable. The baby was placed on maternal abdomen while initial step of NRP were perfmored (Dry, Stimulated, and warmed). Hat placed on baby for thermoregulation. Delayed cord clamping was performed for 30 seconds, then the baby needed a closer  look and the cord was clamped and cut by myself, and placed on the radient warmer, where he pinked right up, then was handed back to mom.. Apgar scores were 9 and 9. Prophylactic Pitocin was started in the third stage of labor for active management. The placenta delivered spontaneously, shultz, with a 3 vessel cord and was sent to LD.  Inspection revealed right labial. An examination of the vaginal vault and cervix was free from lacerations. The uterus was firm, bleeding stable.  The repair was done under local.   Placenta and umbilical artery blood gas were not sent.  There were no complications during the procedure.  Mom and baby skin to skin following delivery. Left in stable condition. Covid testing still pending.   Maternal Info: Anesthesia:Epidural Episiotomy: no Lacerations:  right labial Suture Repair: 3.0 vic rapid Est. Blood Loss (mL):  253  Newborn Info:  Baby Sex: female Circumcision: out pt desired  APGAR (1 MIN): 9   APGAR (5 MINS): 9   APGAR (10 MINS):     Mom to postpartum.  Baby to Couplet care / Skin to Skin.  Dr Mancel Bale and Dr Alwyn Pea updated and given report.   Seba Dalkai, [place], NP-C 05/02/19 7:32 PM

## 2018-12-17 LAB — OB RESULTS CONSOLE ABO/RH: RH Type: POSITIVE

## 2018-12-17 LAB — OB RESULTS CONSOLE GC/CHLAMYDIA
Chlamydia: NEGATIVE
Gonorrhea: NEGATIVE

## 2018-12-17 LAB — OB RESULTS CONSOLE RPR: RPR: NONREACTIVE

## 2018-12-17 LAB — OB RESULTS CONSOLE ANTIBODY SCREEN: Antibody Screen: NEGATIVE

## 2018-12-17 LAB — OB RESULTS CONSOLE HIV ANTIBODY (ROUTINE TESTING): HIV: NONREACTIVE

## 2018-12-17 LAB — OB RESULTS CONSOLE RUBELLA ANTIBODY, IGM: Rubella: IMMUNE

## 2019-04-07 LAB — OB RESULTS CONSOLE GBS: GBS: NEGATIVE

## 2019-05-02 ENCOUNTER — Other Ambulatory Visit: Payer: Self-pay

## 2019-05-02 ENCOUNTER — Encounter (HOSPITAL_COMMUNITY): Payer: Self-pay | Admitting: *Deleted

## 2019-05-02 ENCOUNTER — Inpatient Hospital Stay (HOSPITAL_COMMUNITY)
Admission: AD | Admit: 2019-05-02 | Discharge: 2019-05-04 | DRG: 807 | Disposition: A | Discharge status: Home / Self Care | Payer: Medicaid Other | Attending: Obstetrics and Gynecology | Admitting: Obstetrics and Gynecology

## 2019-05-02 DIAGNOSIS — O9089 Other complications of the puerperium, not elsewhere classified: Secondary | ICD-10-CM | POA: Diagnosis not present

## 2019-05-02 DIAGNOSIS — O26893 Other specified pregnancy related conditions, third trimester: Secondary | ICD-10-CM | POA: Diagnosis present

## 2019-05-02 DIAGNOSIS — Z1159 Encounter for screening for other viral diseases: Secondary | ICD-10-CM | POA: Diagnosis not present

## 2019-05-02 DIAGNOSIS — D649 Anemia, unspecified: Secondary | ICD-10-CM | POA: Diagnosis present

## 2019-05-02 DIAGNOSIS — Z3A39 39 weeks gestation of pregnancy: Secondary | ICD-10-CM | POA: Diagnosis not present

## 2019-05-02 DIAGNOSIS — R55 Syncope and collapse: Secondary | ICD-10-CM | POA: Diagnosis not present

## 2019-05-02 DIAGNOSIS — O9902 Anemia complicating childbirth: Secondary | ICD-10-CM | POA: Diagnosis present

## 2019-05-02 DIAGNOSIS — Z3490 Encounter for supervision of normal pregnancy, unspecified, unspecified trimester: Secondary | ICD-10-CM

## 2019-05-02 LAB — CBC
HCT: 31.7 % — ABNORMAL LOW (ref 36.0–46.0)
Hemoglobin: 9.4 g/dL — ABNORMAL LOW (ref 12.0–15.0)
MCH: 21.6 pg — ABNORMAL LOW (ref 26.0–34.0)
MCHC: 29.7 g/dL — ABNORMAL LOW (ref 30.0–36.0)
MCV: 72.9 fL — ABNORMAL LOW (ref 80.0–100.0)
Platelets: 147 10*3/uL — ABNORMAL LOW (ref 150–400)
RBC: 4.35 MIL/uL (ref 3.87–5.11)
RDW: 19.9 % — ABNORMAL HIGH (ref 11.5–15.5)
WBC: 14.2 10*3/uL — ABNORMAL HIGH (ref 4.0–10.5)
nRBC: 0.5 % — ABNORMAL HIGH (ref 0.0–0.2)

## 2019-05-02 LAB — TYPE AND SCREEN
ABO/RH(D): O POS
Antibody Screen: NEGATIVE

## 2019-05-02 LAB — SARS CORONAVIRUS 2 BY RT PCR (HOSPITAL ORDER, PERFORMED IN ~~LOC~~ HOSPITAL LAB): SARS Coronavirus 2: NEGATIVE

## 2019-05-02 MED ORDER — TETANUS-DIPHTH-ACELL PERTUSSIS 5-2.5-18.5 LF-MCG/0.5 IM SUSP
0.5000 mL | Freq: Once | INTRAMUSCULAR | Status: AC
  Administered 2019-05-03: 11:00:00 0.5 mL via INTRAMUSCULAR
  Filled 2019-05-02: qty 0.5

## 2019-05-02 MED ORDER — OXYTOCIN BOLUS FROM INFUSION
500.0000 mL | Freq: Once | INTRAVENOUS | Status: AC
  Administered 2019-05-02: 19:00:00 500 mL/h via INTRAVENOUS

## 2019-05-02 MED ORDER — PRENATAL MULTIVITAMIN CH
1.0000 | ORAL_TABLET | Freq: Every day | ORAL | Status: DC
  Administered 2019-05-03 – 2019-05-04 (×2): 1 via ORAL
  Filled 2019-05-02 (×2): qty 1

## 2019-05-02 MED ORDER — ACETAMINOPHEN 325 MG PO TABS
650.0000 mg | ORAL_TABLET | ORAL | Status: DC | PRN
  Administered 2019-05-03: 03:00:00 650 mg via ORAL
  Filled 2019-05-02: qty 2

## 2019-05-02 MED ORDER — DIPHENHYDRAMINE HCL 25 MG PO CAPS: 25.0000 mg | ORAL_CAPSULE | Freq: Four times a day (QID) | ORAL | Status: DC | PRN

## 2019-05-02 MED ORDER — OXYCODONE-ACETAMINOPHEN 5-325 MG PO TABS: 2.0000 | ORAL_TABLET | ORAL | Status: DC | PRN

## 2019-05-02 MED ORDER — ONDANSETRON HCL 4 MG/2ML IJ SOLN
4.0000 mg | Freq: Four times a day (QID) | INTRAMUSCULAR | Status: DC | PRN
  Administered 2019-05-02: 4 mg via INTRAVENOUS
  Filled 2019-05-02: qty 2

## 2019-05-02 MED ORDER — KETOROLAC TROMETHAMINE 30 MG/ML IJ SOLN
INTRAMUSCULAR | Status: AC
  Administered 2019-05-02: 20:00:00 30 mg via INTRAVENOUS
  Filled 2019-05-02: qty 1

## 2019-05-02 MED ORDER — IBUPROFEN 600 MG PO TABS
600.0000 mg | ORAL_TABLET | Freq: Four times a day (QID) | ORAL | Status: DC
  Administered 2019-05-03 – 2019-05-04 (×5): 600 mg via ORAL
  Filled 2019-05-02 (×6): qty 1

## 2019-05-02 MED ORDER — BENZOCAINE-MENTHOL 20-0.5 % EX AERO
1.0000 "application " | INHALATION_SPRAY | CUTANEOUS | Status: DC | PRN
  Administered 2019-05-03: 1 via TOPICAL
  Filled 2019-05-02: qty 56

## 2019-05-02 MED ORDER — SENNOSIDES-DOCUSATE SODIUM 8.6-50 MG PO TABS
2.0000 | ORAL_TABLET | ORAL | Status: DC
  Administered 2019-05-03 – 2019-05-04 (×2): 2 via ORAL
  Filled 2019-05-02 (×2): qty 2

## 2019-05-02 MED ORDER — ONDANSETRON HCL 4 MG/2ML IJ SOLN: 4.0000 mg | INTRAMUSCULAR | Status: DC | PRN

## 2019-05-02 MED ORDER — SOD CITRATE-CITRIC ACID 500-334 MG/5ML PO SOLN: 30.0000 mL | ORAL | Status: DC | PRN

## 2019-05-02 MED ORDER — WITCH HAZEL-GLYCERIN EX PADS: 1.0000 "application " | MEDICATED_PAD | CUTANEOUS | Status: DC | PRN

## 2019-05-02 MED ORDER — SIMETHICONE 80 MG PO CHEW: 80.0000 mg | CHEWABLE_TABLET | ORAL | Status: DC | PRN

## 2019-05-02 MED ORDER — LACTATED RINGERS IV SOLN
500.0000 mL | INTRAVENOUS | Status: DC | PRN
  Administered 2019-05-02: 21:00:00 1000 mL via INTRAVENOUS

## 2019-05-02 MED ORDER — OXYCODONE-ACETAMINOPHEN 5-325 MG PO TABS: 1.0000 | ORAL_TABLET | ORAL | Status: DC | PRN

## 2019-05-02 MED ORDER — ZOLPIDEM TARTRATE 5 MG PO TABS: 5.0000 mg | ORAL_TABLET | Freq: Every evening | ORAL | Status: DC | PRN

## 2019-05-02 MED ORDER — COCONUT OIL OIL: 1.0000 "application " | TOPICAL_OIL | Status: DC | PRN

## 2019-05-02 MED ORDER — KETOROLAC TROMETHAMINE 30 MG/ML IJ SOLN
30.0000 mg | Freq: Once | INTRAMUSCULAR | Status: AC
  Administered 2019-05-02: 20:00:00 30 mg via INTRAVENOUS

## 2019-05-02 MED ORDER — ONDANSETRON HCL 4 MG PO TABS: 4.0000 mg | ORAL_TABLET | ORAL | Status: DC | PRN

## 2019-05-02 MED ORDER — LACTATED RINGERS IV SOLN: INTRAVENOUS | Status: DC

## 2019-05-02 MED ORDER — DIBUCAINE (PERIANAL) 1 % EX OINT: 1.0000 "application " | TOPICAL_OINTMENT | CUTANEOUS | Status: DC | PRN

## 2019-05-02 MED ORDER — ACETAMINOPHEN 325 MG PO TABS
650.0000 mg | ORAL_TABLET | ORAL | Status: DC | PRN
  Administered 2019-05-02: 20:00:00 650 mg via ORAL
  Filled 2019-05-02: qty 2

## 2019-05-02 MED ORDER — LIDOCAINE HCL (PF) 1 % IJ SOLN
30.0000 mL | INTRAMUSCULAR | Status: DC | PRN
  Administered 2019-05-02: 30 mL via SUBCUTANEOUS
  Filled 2019-05-02: qty 30

## 2019-05-02 MED ORDER — FERROUS SULFATE 325 (65 FE) MG PO TABS
325.0000 mg | ORAL_TABLET | Freq: Two times a day (BID) | ORAL | Status: DC
  Administered 2019-05-03 – 2019-05-04 (×3): 325 mg via ORAL
  Filled 2019-05-02 (×4): qty 1

## 2019-05-02 MED ORDER — OXYTOCIN 40 UNITS IN NORMAL SALINE INFUSION - SIMPLE MED
2.5000 [IU]/h | INTRAVENOUS | Status: DC
  Filled 2019-05-02: qty 1000

## 2019-05-02 NOTE — Progress Notes (Signed)
S: RN  Called on PPD#0 now post pt getting up to use the bathroom, pt saw her own blood and had a syncopal episode, pt did not hit her head, pt then in bed and stable denies being dizzy, BP a little low.   O: BP 90/64   Pulse (!) 122   Temp 99.2 F (37.3 C) (Oral)   Resp 18   SpO2 100%   A/P: syncopal episode, 1000mg  LR bolus ordered, monitor BP and VS, cbc 9.4, will recheck in morning, QBL 253 total, call if needed.

## 2019-05-02 NOTE — H&P (Signed)
Kimberly Odom is a 23 y.o. female, G1P0000, IUP at 39.1 weeks, presenting for spontaneous labor with vaginal bleeding. Pt was cxt and had been cxt since this morning, but 2 hours ago got a little worse, pt was still able to talk through her cxt on the phone, she called back one hour later and was not able to talk, her grandmother stated she was starting to have some red bleeding, I informed her to come to the hospital asap, eta was ten min, on arrival I was in the mau and assessed, her there was light amount of frank red blood and she was found to be complete, Pt endorse + Fm. Denies vaginal leakage. Female baby desired out pt circ. Prenatal Labs:  hgb 10.1 @ 20 weeks, O+, neg antibody, hiv-, rpr nr, neg gc/c, Glucola wnl, varicella non immune, hep b -, gbs-, hsv+ no meds. Thyroid issues In 2015 since tsh wnl pt unaware of issues, this was noted on records, anatomy US completed onm 12/2018, pap wnl 2020, normal quad screen and neg CF screen. Declined flu and tdap. EFW @ 35.4 wg, 6.2lbs with anterior placenta.   Patient Active Problem List   Diagnosis Date Noted  . Pregnancy 05/02/2019  . Iron deficiency anemia 06/03/2015  . School failure 02/11/2014  . Severe eczema 11/12/2013  . Irregular menstrual cycle 11/12/2013  . Contraceptive management 11/12/2013  . BMI (body mass index), pediatric, less than 5th percentile for age 13/07/2014   Medications Prior to Admission  Medication Sig Dispense Refill Last Dose  . cetirizine (ZYRTEC) 10 MG tablet Take 1 tablet (10 mg total) by mouth at bedtime. (Patient not taking: Reported on 08/28/2017) 30 tablet 4   . cholecalciferol (VITAMIN D) 1000 UNITS tablet Take 2 tablets (2,000 Units total) by mouth daily. 60 tablet 3   . feeding supplement, ENSURE COMPLETE, (ENSURE COMPLETE) LIQD Take 237 mLs by mouth 2 (two) times daily between meals. (Patient not taking: Reported on 06/02/2015) 60 Bottle 10   . hydrOXYzine (ATARAX/VISTARIL) 10 MG tablet Take 1 tablet (10 mg  total) by mouth every 6 (six) hours as needed for itching. (Patient not taking: Reported on 06/02/2015) 30 tablet 3   . triamcinolone ointment (KENALOG) 0.1 % Apply 1 application topically 2 (two) times daily. 80 g 4   . triamcinolone ointment (KENALOG) 0.5 % Apply 1 application topically 2 (two) times daily. 45 g 1   . white petrolatum (VASELINE) GEL Apply 1 application topically as needed for lip care or dry skin (Apply frequently during day all over the dry skin). 454 g 6    History reviewed. No pertinent past medical history.   No current facility-administered medications on file prior to encounter.    Current Outpatient Medications on File Prior to Encounter  Medication Sig Dispense Refill  . cetirizine (ZYRTEC) 10 MG tablet Take 1 tablet (10 mg total) by mouth at bedtime. (Patient not taking: Reported on 08/28/2017) 30 tablet 4  . cholecalciferol (VITAMIN D) 1000 UNITS tablet Take 2 tablets (2,000 Units total) by mouth daily. 60 tablet 3  . feeding supplement, ENSURE COMPLETE, (ENSURE COMPLETE) LIQD Take 237 mLs by mouth 2 (two) times daily between meals. (Patient not taking: Reported on 06/02/2015) 60 Bottle 10  . hydrOXYzine (ATARAX/VISTARIL) 10 MG tablet Take 1 tablet (10 mg total) by mouth every 6 (six) hours as needed for itching. (Patient not taking: Reported on 06/02/2015) 30 tablet 3  . triamcinolone ointment (KENALOG) 0.1 % Apply 1 application topically 2 (two)  times daily. 80 g 4  . triamcinolone ointment (KENALOG) 0.5 % Apply 1 application topically 2 (two) times daily. 45 g 1  . white petrolatum (VASELINE) GEL Apply 1 application topically as needed for lip care or dry skin (Apply frequently during day all over the dry skin). 454 g 6     Allergies  Allergen Reactions  . Shellfish Allergy Anaphylaxis  . Peanuts [Peanut Oil]    OB History    Gravida  1   Para      Term      Preterm      AB      Living        SAB      TAB      Ectopic      Multiple      Live  Births             History reviewed. No pertinent past medical history. History reviewed. No pertinent surgical history. Family History: family history is not on file. Social History:  reports that she has never smoked. She has never used smokeless tobacco. No history on file for alcohol and drug.   Prenatal Transfer Tool  Maternal Diabetes: No Genetic Screening: Normal Maternal Ultrasounds/Referrals: Normal and Fetal renal pyelectasis, Bilat.  Fetal Ultrasounds or other Referrals:  None Maternal Substance Abuse:  No Significant Maternal Medications:  None Significant Maternal Lab Results: Group B Strep negative  ROS:  Review of Systems  Constitutional: Negative.   HENT: Negative.   Eyes: Negative.   Respiratory: Negative.   Cardiovascular: Negative.   Gastrointestinal: Positive for abdominal pain.  Genitourinary: Negative.        Vaginal bleeding   Musculoskeletal: Negative.   Skin: Negative.   Neurological: Negative.   Endo/Heme/Allergies: Negative.   Psychiatric/Behavioral: Negative.      Physical Exam: BP 118/69   Pulse 70   Temp 99.2 F (37.3 C) (Oral)   Resp 20   SpO2 100%   Physical Exam  Constitutional: She is oriented to person, place, and time and well-developed, well-nourished, and in no distress.  HENT:  Head: Normocephalic and atraumatic.  Eyes: Pupils are equal, round, and reactive to light. Conjunctivae are normal.  Neck: Normal range of motion. Neck supple.  Cardiovascular: Normal rate and regular rhythm.  Pulmonary/Chest: Effort normal and breath sounds normal.  Abdominal: Soft. Bowel sounds are normal.  Genitourinary:    Genitourinary Comments: Uterus soft and non-tender, vaginal bleeding noted, gravida equal to dates, adequate pelvis for vaginal delivery.    Neurological: She is alert and oriented to person, place, and time. She has normal reflexes. Gait normal.  Skin: Skin is warm and dry.  Psychiatric: Affect normal.  Nursing note and  vitals reviewed.    NST: FHR baseline 130 bpm, Variability: moderate, Accelerations:present, Decelerations:  Absent= Cat 1/Reactive UC:   regular, every 2-3 minutes,m lasting 120 seconds SVE:   Dilation: 10 Effacement (%): 100 Station: Plus 3 Exam by:: JMontana,cnm, vertex verified by fetal sutures.  Leopold's: Position vertex, EFW 7.5lbs via leopold's.   Labs: Results for orders placed or performed during the hospital encounter of 05/02/19 (from the past 24 hour(s))  SARS Coronavirus 2 (CEPHEID - Performed in [facility] hospital lab), Hosp Order     Status: None   Collection Time: 05/02/19  6:27 PM   Specimen: Nasopharyngeal Swab  Result Value Ref Range   SARS Coronavirus 2 NEGATIVE NEGATIVE  Type and screen [facility]  Status: None (Preliminary result)   Collection Time: 05/02/19  6:32 PM  Result Value Ref Range   ABO/RH(D) O POS    Antibody Screen      NEG Performed at [facility], [address] [place], [place] [address]    Sample Expiration PENDING     Imaging:  No results found.  MAU Course: Orders Placed This Encounter  Procedures  . SARS Coronavirus 2 (CEPHEID - Performed in [facility]), [facility]  . OB RESULT CONSOLE Group B Strep  . CBC  . RPR  . OB RESULTS CONSOLE GC/Chlamydia  . OB RESULTS CONSOLE RPR  . OB RESULTS CONSOLE HIV antibody  . OB RESULTS CONSOLE Rubella Antibody  . Diet clear liquid Room service appropriate? Yes; Fluid consistency: Thin  . Vital signs  . Notify Physician  . Activity as tolerated  . Fetal monitoring per unit policy  . Cervical Exam  . Measure blood pressure post delivery every 15 min x 1 hour then every 30 min x 1 hour  . Fundal check post delivery every 15 min x 1 hour then every 30 min x 1 hour  . If Rapid HIV test positive or known HIV positive: initiate AZT orders  . May in and out cath x 2 for inability to void  . Insert foley catheter  . Discontinue foley prior to vaginal  delivery  . Full code  . Type and screen [facility]  . OB RESULTS CONSOLE ABO/Rh  . OB RESULTS CONSOLE Antibody Screen  . Insert and maintain IV Line  . Admit to Inpatient (patient's expected length of stay will be greater than 2 midnights or inpatient only procedure)  . Transfer patient   Meds ordered this encounter  Medications  . lactated ringers infusion  . oxytocin (PITOCIN) IV BOLUS FROM BAG  . oxytocin (PITOCIN) IV infusion 40 units in NS 1000 mL - Premix  . lactated ringers infusion 500-1,000 mL  . acetaminophen (TYLENOL) tablet 650 mg  . oxyCODONE-acetaminophen (PERCOCET/ROXICET) 5-325 MG per tablet 1 tablet  . oxyCODONE-acetaminophen (PERCOCET/ROXICET) 5-325 MG per tablet 2 tablet  . ondansetron (ZOFRAN) injection 4 mg  . sodium citrate-citric acid (ORACIT) solution 30 mL  . lidocaine (PF) (XYLOCAINE) 1 % injection 30 mL    Assessment/Plan: Kimberly Odom is a 23 y.o. female, G1P0000, IUP at 39.1 weeks, presenting for spontaneous labor with vaginal bleeding. Pt was cxt and had been cxt since this morning, but 2 hours ago got a little worse, pt was still able to talk through her cxt on the phone, she called back one hour later and was not able to talk, her grandmother stated she was starting to have some red bleeding, I informed her to come to the hospital asap, eta was ten min, on arrival I was in the mau and assessed, her there was light amount of frank red blood and she was found to be complete, Pt endorse + Fm. Denies vaginal leakage. Female baby desired out pt circ. Prenatal Labs:  hgb 10.1 @ 20 weeks, O+, neg antibody, hiv-, rpr nr, neg gc/c, Glucola wnl, varicella non immune, hep b -, gbs-, hsv+ no meds. Thyroid issues In 2015 since tsh wnl pt unaware of issues, this was noted on records, anatomy US completed onm 12/2018, pap wnl 2020, normal quad screen and neg CF screen. Declined flu and tdap. EFW @ 35.4 wg, 6.2lbs with anterior placenta.    FWB: Cat 1  Fetal Tracing.  Plan: Admit to [place] per consult with Dr Mancel Bale Routine [facility] orders Will push when we arrive on LD.  Anticipate vaginal delivery    [facility] NP-C, CNM, MSN 05/02/2019, 7:31 PM

## 2019-05-02 NOTE — Progress Notes (Signed)
Patient up to bathroom, but unable to void.  Upon standing, patient began to feel hot and appeared to have difficulty keeping her eyes open.  Pt sat back on toilet and passed out while sitting on toilet.  RN at her side the entire time and supported patient while passed out.  Patient came back around spontaneously and was helped back to bed by charge RN and other RNs.  B/P low upon return to bed and patient nauseous.  [facility], CNM notified and LR bolus and Zofran given.  Will keep on L&D until pt feeling better and then may transfer to postpartum room.

## 2019-05-02 NOTE — MAU Note (Signed)
At approximately 1750, pt was assisted out of private vehicle into wheelchair and brought back to room 120 in MAU. Assisted pt to undress and into bed. Nugent NP at bedside.  Radnor at bedside  1756 EFM applied, FHR 130  1759 SVE by [facility], 10cm  1800 Report called to Carleene Overlie RN by Fredda Hammed RN, pt to room 216  Transported pt to room 216 in bed

## 2019-05-02 NOTE — Progress Notes (Addendum)
Subjective: Postpartum Day # 1 : S/P NSVD due to spontaneous labor and delivery. Patient up ad lib, denies syncope or dizziness. Reports consuming regular diet without issues and denies N/V. Patient reports 0 bowel movement + passing flatus.  Denies issues with urination and reports bleeding is "lighter."  Patient is bottle feeding and reports going well.  Desires undecided for postpartum contraception.  Pain is being appropriately managed with use of po meds. Pt starting HGB was only 9.4 and dropped postpartum to 6.9, started on iron, denies s/sx. Pt feels stable not dizzy, just fatigued.   Right labial laceration Feeding:  bottle Contraceptive plan:  undecided BB: Circ out pt desired  Objective: Vital signs in last 24 hours: Patient Vitals for the past 24 hrs:  BP Temp Temp src Pulse Resp SpO2  05/03/19 0637 106/73 99.1 F (37.3 C) Oral 79 16 100 %  05/03/19 0247 110/83 98.7 F (37.1 C) Oral 84 18 100 %  05/02/19 2241 110/72 98.2 F (36.8 C) - 62 16 -  05/02/19 2136 110/78 98.2 F (36.8 C) - 71 - 99 %  05/02/19 2115 100/84 - - 75 16 -  05/02/19 2100 122/68 - - (!) 59 18 -  05/02/19 2045 111/76 - - 67 16 -  05/02/19 2015 90/64 - - (!) 122 18 -  05/02/19 2000 (!) 124/96 - - 61 16 -  05/02/19 1917 - - - - 20 -  05/02/19 1851 118/69 99.2 F (37.3 C) Oral 70 18 -  05/02/19 1809 137/81 - - 67 18 -  05/02/19 1758 121/78 - - 64 20 100 %     Physical Exam:  General: alert, cooperative, appears stated age and no distress Mood/Affect: Flat Lungs: clear to auscultation, no wheezes, rales or rhonchi, symmetric air entry.  Heart: normal rate, regular rhythm, normal S1, S2, no murmurs, rubs, clicks or gallops. Breast: breasts appear normal, no suspicious masses, no skin or nipple changes or axillary nodes. Abdomen:  + bowel sounds, soft, non-tender GU: perineum intact, healing well. No signs of external hematomas.  Uterine Fundus: firm Lochia: appropriate Skin: Warm, Dry. DVT  Evaluation: No evidence of DVT seen on physical exam. Negative Homan's sign. No cords or calf tenderness. No significant calf/ankle edema.  CBC Latest Ref Rng & Units 05/03/2019 05/02/2019 06/02/2015  WBC 4.0 - 10.5 K/uL 14.4(H) 14.2(H) 9.3  Hemoglobin 12.0 - 15.0 g/dL 6.9(LL) 9.4(L) 10.4(L)  Hematocrit 36.0 - 46.0 % 23.3(L) 31.7(L) 33.6(L)  Platelets 150 - 400 K/uL 132(L) 147(L) 327    Results for orders placed or performed during the hospital encounter of 05/02/19 (from the past 24 hour(s))  SARS Coronavirus 2 (CEPHEID - Performed in [facility] hospital lab), Hosp Order     Status: None   Collection Time: 05/02/19  6:27 PM   Specimen: Nasopharyngeal Swab  Result Value Ref Range   SARS Coronavirus 2 NEGATIVE NEGATIVE  Type and screen [facility]     Status: None   Collection Time: 05/02/19  6:32 PM  Result Value Ref Range   ABO/RH(D) O POS    Antibody Screen NEG    Sample Expiration      05/05/2019,2359 Performed at [facility], [address] [place], [place] [address]   ABO/Rh     Status: None   Collection Time: 05/02/19  6:43 PM  Result Value Ref Range   ABO/RH(D)      O POS Performed at [facility], [address], [place], [place] [address]   CBC     Status: Abnormal   Collection Time: 05/02/19  7:32 PM  Result Value Ref Range   WBC 14.2 (H) 4.0 - 10.5 K/uL   RBC 4.35 3.87 - 5.11 MIL/uL   Hemoglobin 9.4 (L) 12.0 - 15.0 g/dL   HCT 31.7 (L) 36.0 - 46.0 %   MCV 72.9 (L) 80.0 - 100.0 fL   MCH 21.6 (L) 26.0 - 34.0 pg   MCHC 29.7 (L) 30.0 - 36.0 g/dL   RDW 19.9 (H) 11.5 - 15.5 %   Platelets 147 (L) 150 - 400 K/uL   nRBC 0.5 (H) 0.0 - 0.2 %  CBC     Status: Abnormal   Collection Time: 05/03/19  4:49 AM  Result Value Ref Range   WBC 14.4 (H) 4.0 - 10.5 K/uL   RBC 3.20 (L) 3.87 - 5.11 MIL/uL   Hemoglobin 6.9 (LL) 12.0 - 15.0 g/dL   HCT 23.3 (L) 36.0 - 46.0 %   MCV 72.8 (L) 80.0 - 100.0 fL   MCH 21.6 (L) 26.0 - 34.0 pg   MCHC 29.6 (L)  30.0 - 36.0 g/dL   RDW 19.3 (H) 11.5 - 15.5 %   Platelets 132 (L) 150 - 400 K/uL   nRBC 0.7 (H) 0.0 - 0.2 %     CBG (last 3)  No results for input(s): GLUCAP in the last 72 hours.   I/O last 3 completed shifts: In: -  Out: 252 [Blood:252]   Assessment Postpartum Day # 1 : S/P NSVD due to spontaneous labor and delivery. Pt stable. -2 involution. Bottle feeding. Hemodynamically stable, starting hgb was 9.4 and post delivery was 6.9, denies s/sx. .   Plan: Continue other mgmt as ordered Anemia: Start Iron BID Dr Mancel Bale to talk with pt about possible blood transfusion versus iron infusion.  VTE prophylactics: Early ambulated as tolerates.  Pain control: Motrin/Tylenol PRN Education given regarding options for contraception, including barrier methods, injectable contraception, IUD placement, oral contraceptives.  Plan for discharge tomorrow and Contraception undecided, female circ to be performed out pt.    Dr. Mancel Bale to be updated on patient status @ 0700 when she will assume care of the pt.   [facility] NP-C, CNM 05/03/2019, 6:59 AM  Pt is with asymptomatic anemia.  She does not feel like she needs blood and says she feels fine.  She has been up out of bed and ambulated without difficulty.  Denies HA, lightheadedness or dizziness.  Will consider IV iron infusion.

## 2019-05-03 ENCOUNTER — Other Ambulatory Visit: Payer: Self-pay

## 2019-05-03 LAB — CBC
HCT: 23.3 % — ABNORMAL LOW (ref 36.0–46.0)
Hemoglobin: 6.9 g/dL — CL (ref 12.0–15.0)
MCH: 21.6 pg — ABNORMAL LOW (ref 26.0–34.0)
MCHC: 29.6 g/dL — ABNORMAL LOW (ref 30.0–36.0)
MCV: 72.8 fL — ABNORMAL LOW (ref 80.0–100.0)
Platelets: 132 10*3/uL — ABNORMAL LOW (ref 150–400)
RBC: 3.2 MIL/uL — ABNORMAL LOW (ref 3.87–5.11)
RDW: 19.3 % — ABNORMAL HIGH (ref 11.5–15.5)
WBC: 14.4 10*3/uL — ABNORMAL HIGH (ref 4.0–10.5)
nRBC: 0.7 % — ABNORMAL HIGH (ref 0.0–0.2)

## 2019-05-03 LAB — RPR: RPR Ser Ql: NONREACTIVE

## 2019-05-03 LAB — ABO/RH: ABO/RH(D): O POS

## 2019-05-03 NOTE — Clinical Social Work Maternal (Addendum)
CLINICAL SOCIAL WORK MATERNAL/CHILD NOTE  Patient Details  Name: Kimberly Odom MRN: [id number] Date of Birth: 12/18/1995  Date:  05/03/2019  Clinical Social Worker Initiating Note:  Kimberly Odom Date/Time: Initiated:  05/03/19/0900     Child's Name:  Kimberly Odom   Biological Parents:  Mother(Kimberly Odom (MOB declined to provide FOB's information as he is not involved))   Need for Interpreter:  None   Reason for Referral:  Competency/Guardianship    Address:  [address] [place] [address]    Phone number:  [phone] (MOB's cell phone)    Additional phone number:   Household Members/Support Persons (HM/SP):   Household Member/Support Person 1, Household Member/Support Person 2, Household Member/Support Person 3, Household Member/Support Person 4   HM/SP Name Relationship DOB or Age  HM/SP -1 Kimberly Odom Mother    HM/SP -2   Father    HM/SP -3   Brother    HM/SP -4 Kimberly Odom Nephew 7  HM/SP -5        HM/SP -6        HM/SP -7        HM/SP -8          Natural Supports (not living in the home):      Professional Supports: None   Employment: Unemployed   Type of Work:     Education:  Some College   Homebound arranged:    Financial Resources:  Medicaid   Other Resources:  WIC, Food Stamps    Cultural/Religious Considerations Which May Impact Care:    Strengths:  Ability to meet basic needs , Home prepared for child , Pediatrician chosen   Psychotropic Medications:         Pediatrician:    [place] area  Pediatrician List:   [facility]  [place]    [place]    [place]    [place]    [place]      Pediatrician Fax Number:    Risk Factors/Current Problems:      Cognitive State:  Able to Concentrate , Alert , Linear Thinking    Mood/Affect:  Calm , Comfortable , Interested , Happy , Relaxed    CSW Assessment:  CSW received consult for assistance  with resources and for concern in the past about guardianship.  CSW met with MOB to offer support and complete assessment.    MOB resting in bed with MOB's mother present at bedside, holding infant, when CSW entered the room. CSW introduced self and received verbal permission from MOB to complete assessment with MOB's mother present. CSW explained reason for consult and MOB expressed understanding. MOB's mother did not speak much during assessment but was tending to infant. MOB pleasant and easy to engage and answered questions appropriately throughout visit. CSW did not observe MOB interacting with infant during her visit as MOB's mother was holding infant and MOB was answering CSW's questions. MOB did however seem proud and happy when speaking about infant. Per MOB, she currently lives with her mother, father, brother and nephew in [place]. MOB stated her highest level of education completed was some college and denied currently being employed. MOB confirmed she receives both WIC and food stamps and was informed of the process to get infant added on to her plans. CSW inquired about MOB's mental health history and MOB denied having any history. CSW asked about possible depression noted in 2015 and MOB and MGM seemed confused and only   referenced her hospitalization due to her eczema. CSW provided education regarding the baby blues period vs. perinatal mood disorders, discussed treatment and gave resources for mental health follow up if concerns arise.  CSW recommends self-evaluation during the postpartum time period using the New Mom Checklist from Postpartum Progress and encouraged MOB to contact a medical professional if symptoms are noted at any time. MOB denied any current SI, HI or DV and reported having good support from her parents.   MOB confirmed having all essential items for infant once discharged and stated infant would be sleeping in a basinet once home. CSW provided review of Sudden Infant  Death Syndrome (SIDS) precautions and safe sleeping habits which MOB seemed receptive to. CSW inquired about MOB's interest in being referred for additional in-home supports and MOB declined at this time. MOB and MGM both reported feeling well prepared to take infant home and reported feeling well supported at home. MOB and MGM denied any further questions, concerns or need for resources at this time. Please reconsult CSW if needs or concerns arise.   CSW Plan/Description:  No Further Intervention Required/No Barriers to Discharge, Sudden Infant Death Syndrome (SIDS) Education, Perinatal Mood and Anxiety Disorder (PMADs) Education    Kimberly Odom, LCSWA 05/03/2019, 9:35 AM  

## 2019-05-04 LAB — CBC WITH DIFFERENTIAL/PLATELET
Abs Immature Granulocytes: 0.12 10*3/uL — ABNORMAL HIGH (ref 0.00–0.07)
Basophils Absolute: 0 10*3/uL (ref 0.0–0.1)
Basophils Relative: 0 %
Eosinophils Absolute: 0.1 10*3/uL (ref 0.0–0.5)
Eosinophils Relative: 0 %
HCT: 21.2 % — ABNORMAL LOW (ref 36.0–46.0)
Hemoglobin: 6.3 g/dL — CL (ref 12.0–15.0)
Immature Granulocytes: 1 %
Lymphocytes Relative: 17 %
Lymphs Abs: 2.3 10*3/uL (ref 0.7–4.0)
MCH: 22 pg — ABNORMAL LOW (ref 26.0–34.0)
MCHC: 29.7 g/dL — ABNORMAL LOW (ref 30.0–36.0)
MCV: 73.9 fL — ABNORMAL LOW (ref 80.0–100.0)
Monocytes Absolute: 1.1 10*3/uL — ABNORMAL HIGH (ref 0.1–1.0)
Monocytes Relative: 8 %
Neutro Abs: 10.1 10*3/uL — ABNORMAL HIGH (ref 1.7–7.7)
Neutrophils Relative %: 74 %
Platelets: 138 10*3/uL — ABNORMAL LOW (ref 150–400)
RBC: 2.87 MIL/uL — ABNORMAL LOW (ref 3.87–5.11)
RDW: 20.4 % — ABNORMAL HIGH (ref 11.5–15.5)
WBC: 13.7 10*3/uL — ABNORMAL HIGH (ref 4.0–10.5)
nRBC: 0.6 % — ABNORMAL HIGH (ref 0.0–0.2)

## 2019-05-04 MED ORDER — FERROUS SULFATE 325 (65 FE) MG PO TABS: 325.0000 mg | ORAL_TABLET | Freq: Two times a day (BID) | ORAL | 2 refills | Status: AC

## 2019-05-04 MED ORDER — IBUPROFEN 600 MG PO TABS: 600.0000 mg | ORAL_TABLET | Freq: Four times a day (QID) | ORAL | 0 refills | Status: DC | PRN

## 2019-05-04 NOTE — Discharge Instructions (Signed)
Postpartum Care After Vaginal Delivery ° °The period of time right after you deliver your newborn is called the postpartum period. °What kind of medical care will I receive? °· You may continue to receive fluids and medicines through an IV tube inserted into one of your veins. °· If an incision was made near your vagina (episiotomy) or if you had some vaginal tearing during delivery, cold compresses may be placed on your episiotomy or your tear. This helps to reduce pain and swelling. °· You may be given a squirt bottle to use when you go to the bathroom. You may use this until you are comfortable wiping as usual. To use the squirt bottle, follow these steps: °? Before you urinate, fill the squirt bottle with warm water. Do not use hot water. °? After you urinate, while you are sitting on the toilet, use the squirt bottle to rinse the area around your urethra and vaginal opening. This rinses away any urine and blood. °? You may do this instead of wiping. As you start healing, you may use the squirt bottle before wiping yourself. Make sure to wipe gently. °? Fill the squirt bottle with clean water every time you use the bathroom. °· You will be given sanitary pads to wear. °How can I expect to feel? °· You may not feel the need to urinate for several hours after delivery. °· You will have some soreness and pain in your abdomen and vagina. °· If you are breastfeeding, you may have uterine contractions every time you breastfeed for up to several weeks postpartum. Uterine contractions help your uterus return to its normal size. °· It is normal to have vaginal bleeding (lochia) after delivery. The amount and appearance of lochia is often similar to a menstrual period in the first week after delivery. It will gradually decrease over the next few weeks to a dry, yellow-brown discharge. For most women, lochia stops completely by 6-8 weeks after delivery. Vaginal bleeding can vary from woman to woman. °· Within the first few  days after delivery, you may have breast engorgement. This is when your breasts feel heavy, full, and uncomfortable. Your breasts may also throb and feel hard, tightly stretched, warm, and tender. After this occurs, you may have milk leaking from your breasts. Your health care provider can help you relieve discomfort due to breast engorgement. Breast engorgement should go away within a few days. °· You may feel more sad or worried than normal due to hormonal changes after delivery. These feelings should not last more than a few days. If these feelings do not go away after several days, speak with your health care provider. °How should I care for myself? °· Tell your health care provider if you have pain or discomfort. °· Drink enough water to keep your urine clear or pale yellow. °· Wash your hands thoroughly with soap and water for at least 20 seconds after changing your sanitary pads, after using the toilet, and before holding or feeding your baby. °· If you are not breastfeeding, avoid touching your breasts a lot. Doing this can make your breasts produce more milk. °· If you become weak or lightheaded, or you feel like you might faint, ask for help before: °? Getting out of bed. °? Showering. °· Change your sanitary pads frequently. Watch for any changes in your flow, such as a sudden increase in volume, a change in color, the passing of large blood clots. If you pass a blood clot from your vagina,   save it to show to your health care provider. Do not flush blood clots down the toilet without having your health care provider look at them.  Make sure that all your vaccinations are up to date. This can help protect you and your baby from getting certain diseases. You may need to have immunizations done before you leave the hospital.  If desired, talk with your health care provider about methods of family planning or birth control (contraception). How can I start bonding with my baby? Spending as much time as  possible with your baby is very important. During this time, you and your baby can get to know each other and develop a bond. Having your baby stay with you in your room (rooming in) can give you time to get to know your baby. Rooming in can also help you become comfortable caring for your baby. Breastfeeding can also help you bond with your baby. How can I plan for returning home with my baby?  Make sure that you have a car seat installed in your vehicle. ? Your car seat should be checked by a certified car seat installer to make sure that it is installed safely. ? Make sure that your baby fits into the car seat safely.  Ask your health care provider any questions you have about caring for yourself or your baby. Make sure that you are able to contact your health care provider with any questions after leaving the hospital. This information is not intended to replace advice given to you by your health care provider. Make sure you discuss any questions you have with your health care provider. Document Released: 08/18/2007 Document Revised: 03/25/2016 Document Reviewed: 09/25/2015 Elsevier Interactive Patient Education  2018 [employer].   Postpartum Depression and Baby Blues The postpartum period begins right after the birth of a baby. During this time, there is often a great amount of joy and excitement. It is also a time of many changes in the life of the parents. Regardless of how many times a mother gives birth, each child brings new challenges and dynamics to the family. It is not unusual to have feelings of excitement along with confusing shifts in moods, emotions, and thoughts. All mothers are at risk of developing postpartum depression or the "baby blues." These mood changes can occur right after giving birth, or they may occur many months after giving birth. The baby blues or postpartum depression can be mild or severe. Additionally, postpartum depression can go away rather quickly, or it can  be a long-term condition. What are the causes? Raised hormone levels and the rapid drop in those levels are thought to be a main cause of postpartum depression and the baby blues. A number of hormones change during and after pregnancy. Estrogen and progesterone usually decrease right after the delivery of your baby. The levels of thyroid hormone and various cortisol steroids also rapidly drop. Other factors that play a role in these mood changes include major life events and genetics. What increases the risk? If you have any of the following risks for the baby blues or postpartum depression, know what symptoms to watch out for during the postpartum period. Risk factors that may increase the likelihood of getting the baby blues or postpartum depression include:  Having a personal or family history of depression.  Having depression while being pregnant.  Having premenstrual mood issues or mood issues related to oral contraceptives.  Having a lot of life stress.  Having marital conflict.  Lacking  a social support network.  Having a baby with special needs.  Having health problems, such as diabetes.  What are the signs or symptoms? Symptoms of baby blues include:  Brief changes in mood, such as going from extreme happiness to sadness.  Decreased concentration.  Difficulty sleeping.  Crying spells, tearfulness.  Irritability.  Anxiety.  Symptoms of postpartum depression typically begin within the first month after giving birth. These symptoms include:  Difficulty sleeping or excessive sleepiness.  Marked weight loss.  Agitation.  Feelings of worthlessness.  Lack of interest in activity or food.  Postpartum psychosis is a very serious condition and can be dangerous. Fortunately, it is rare. Displaying any of the following symptoms is cause for immediate medical attention. Symptoms of postpartum psychosis include:  Hallucinations and delusions.  Bizarre or disorganized  behavior.  Confusion or disorientation.  How is this diagnosed? A diagnosis is made by an evaluation of your symptoms. There are no medical or lab tests that lead to a diagnosis, but there are various questionnaires that a health care provider may use to identify those with the baby blues, postpartum depression, or psychosis. Often, a screening tool called the [place] Postnatal Depression Scale is used to diagnose depression in the postpartum period. How is this treated? The baby blues usually goes away on its own in 1-2 weeks. Social support is often all that is needed. You will be encouraged to get adequate sleep and rest. Occasionally, you may be given medicines to help you sleep. Postpartum depression requires treatment because it can last several months or longer if it is not treated. Treatment may include individual or group therapy, medicine, or both to address any social, physiological, and psychological factors that may play a role in the depression. Regular exercise, a healthy diet, rest, and social support may also be strongly recommended. Postpartum psychosis is more serious and needs treatment right away. Hospitalization is often needed. Follow these instructions at home:  Get as much rest as you can. Nap when the baby sleeps.  Exercise regularly. Some women find yoga and walking to be beneficial.  Eat a balanced and nourishing diet.  Do little things that you enjoy. Have a cup of tea, take a bubble bath, read your favorite magazine, or listen to your favorite music.  Avoid alcohol.  Ask for help with household chores, cooking, grocery shopping, or running errands as needed. Do not try to do everything.  Talk to people close to you about how you are feeling. Get support from your partner, family members, friends, or other new moms.  Try to stay positive in how you think. Think about the things you are grateful for.  Do not spend a lot of time alone.  Only take  over-the-counter or prescription medicine as directed by your health care provider.  Keep all your postpartum appointments.  Let your health care provider know if you have any concerns. Contact a health care provider if: You are having a reaction to or problems with your medicine. Get help right away if:  You have suicidal feelings.  You think you may harm the baby or someone else. This information is not intended to replace advice given to you by your health care provider. Make sure you discuss any questions you have with your health care provider. Document Released: 07/25/2004 Document Revised: 03/28/2016 Document Reviewed: 08/02/2013 Elsevier Interactive Patient Education  2017 [employer].   Iron Deficiency Anemia, Adult Iron-deficiency anemia is when you have a low amount of red  blood cells or hemoglobin. This happens because you have too little iron in your body. Hemoglobin carries oxygen to parts of the body. Anemia can cause your body to not get enough oxygen. It may or may not cause symptoms. Follow these instructions at home: Medicines  Take over-the-counter and prescription medicines only as told by your doctor. This includes iron pills (supplements) and vitamins.  If you cannot handle taking iron pills by mouth, ask your doctor about getting iron through: ? A vein (intravenously). ? A shot (injection) into a muscle.  Take iron pills when your stomach is empty. If you cannot handle this, take them with food.  Do not drink milk or take antacids at the same time as your iron pills.  To prevent trouble pooping (constipation), eat fiber or take medicine (stool softener) as told by your doctor. Eating and drinking   Talk with your doctor before changing the foods you eat. He or she may tell you to eat foods that have a lot of iron, such as: ? Liver. ? Lowfat (lean) beef. ? Breads and cereals that have iron added to them (fortified breads and cereals). ? Eggs. ? Dried  fruit. ? Dark green, leafy vegetables.  Drink enough fluid to keep your pee (urine) clear or pale yellow.  Eat fresh fruits and vegetables that are high in vitamin C. They help your body to use iron. Foods with a lot of vitamin C include: ? Oranges. ? Peppers. ? Tomatoes. ? Mangoes. General instructions  Return to your normal activities as told by your doctor. Ask your doctor what activities are safe for you.  Keep yourself clean, and keep things clean around you (your surroundings). Anemia can make you get sick more easily.  Keep all follow-up visits as told by your doctor. This is important. Contact a doctor if:  You feel sick to your stomach (nauseous).  You throw up (vomit).  You feel weak.  You are sweating for no clear reason.  You have trouble pooping, such as: ? Pooping (having a bowel movement) less than 3 times a week. ? Straining to poop. ? Having poop that is hard, dry, or larger than normal. ? Feeling full or bloated. ? Pain in the lower belly. ? Not feeling better after pooping. Get help right away if:  You pass out (faint). If this happens, do not drive yourself to the hospital. Call your local emergency services (911 in the [place]  You have chest pain.  You have shortness of breath that: ? Is very bad. ? Gets worse with physical activity.  You have a fast heartbeat.  You get light-headed when getting up from sitting or lying down. This information is not intended to replace advice given to you by your health care provider. Make sure you discuss any questions you have with your health care provider. Document Released: 11/23/2010 Document Revised: 10/03/2017 Document Reviewed: 07/10/2016 Elsevier Patient Education  2020 [employer]

## 2019-05-04 NOTE — Discharge Summary (Signed)
OB Discharge Summary     Patient Name: Kimberly Odom DOB: 03/02/1996 MRN: [id number]  Date of admission: 05/02/2019 Delivering MD: Dale DurhamMONTANA, JADE   Date of discharge: 05/04/2019  Admitting diagnosis: CTX Intrauterine pregnancy: 2127w1d     Secondary diagnosis:  Active Problems:   Pregnancy      Discharge diagnosis: Term Pregnancy Delivered and Anemia                                                                                                Post partum procedures:n/a  Augmentation: AROM  Complications: None  Hospital course:  Onset of Labor With Vaginal Delivery     23 y.o. yo G1P1001 at 4927w1d was admitted in Active Labor on 05/02/2019. Patient had an uncomplicated labor course as follows:  Membrane Rupture Time/Date: 6:16 PM ,05/02/2019   Intrapartum Procedures: Episiotomy: None [1]                                         Lacerations:  Labial [10]  Patient had a delivery of a Viable infant. 05/02/2019  Information for the patient's newborn:  Vania ReaKing, Boy Shauntea [[id number]][[id number]]  Delivery Method: Vaginal, Spontaneous(Filed from Delivery Summary)     Pateint had an uncomplicated postpartum course.  She is ambulating, tolerating a regular diet, passing flatus, and urinating well. Patient is discharged home in stable condition on 05/04/19.   Physical exam  Vitals:   05/03/19 1300 05/03/19 1529 05/03/19 2200 05/04/19 0701  BP:  107/69 (!) 94/55 (!) 90/56  Pulse:  78 86 62  Resp:  16 17 18   Temp:  98.5 F (36.9 C) 98.7 F (37.1 C) 98.1 F (36.7 C)  TempSrc:  Oral Oral Oral  SpO2:   100%   Weight: 55.8 kg     Height: 5\' 1"  (1.549 m)      General: alert, cooperative and no distress Lochia: appropriate Uterine Fundus: firm Incision: N/A DVT Evaluation: No evidence of DVT seen on physical exam. Negative Homan's sign. No cords or calf tenderness. No significant calf/ankle edema. Anemia: patient denies symptoms of anemia other than fatigue  Labs: Lab Results  Component  Value Date   WBC 13.7 (H) 05/04/2019   HGB 6.3 (LL) 05/04/2019   HCT 21.2 (L) 05/04/2019   MCV 73.9 (L) 05/04/2019   PLT 138 (L) 05/04/2019   CMP Latest Ref Rng & Units 12/01/2013  Glucose 70 - 99 mg/dL 782(N167(H)  BUN 6 - 23 mg/dL 14  Creatinine 5.620.47 - 1.301.00 mg/dL 8.650.52  Sodium 784137 - 696147 mEq/L 139  Potassium 3.7 - 5.3 mEq/L 4.3  Chloride 96 - 112 mEq/L 101  CO2 19 - 32 mEq/L 22  Calcium 8.4 - 10.5 mg/dL 8.8  Total Protein 6.0 - 8.3 g/dL 7.1  Total Bilirubin 0.3 - 1.2 mg/dL <2.9(B<0.2(L)  Alkaline Phos 47 - 119 U/L 76  AST 0 - 37 U/L 18  ALT 0 - 35 U/L 15    Discharge instruction: per After Visit Summary and "Baby and  Me Booklet".  After visit meds:  Allergies as of 05/04/2019      Reactions   Shellfish Allergy Anaphylaxis   Peanuts [peanut Oil]       Medication List    STOP taking these medications   cetirizine 10 MG tablet Commonly known as: ZYRTEC   feeding supplement (ENSURE COMPLETE) Liqd   hydrOXYzine 10 MG tablet Commonly known as: ATARAX/VISTARIL   triamcinolone ointment 0.1 % Commonly known as: KENALOG   triamcinolone ointment 0.5 % Commonly known as: KENALOG   white petrolatum Gel Commonly known as: VASELINE     TAKE these medications   cholecalciferol 25 MCG (1000 UT) tablet Commonly known as: VITAMIN D Take 2 tablets (2,000 Units total) by mouth daily.   ferrous sulfate 325 (65 FE) MG tablet Take 1 tablet (325 mg total) by mouth 2 (two) times daily with a meal.   ibuprofen 600 MG tablet Commonly known as: ADVIL Take 1 tablet (600 mg total) by mouth every 6 (six) hours as needed for moderate pain.       Diet: routine diet, continue PO Iron QD for asymptomatic anemia   Activity: Advance as tolerated. Pelvic rest for 6 weeks.   Outpatient follow up:6 weeks Follow up Appt:No future appointments. Follow up Visit:No follow-ups on file.  Postpartum contraception: Undecided  Newborn Data: Live born female  Birth Weight: 7 lb 6.3 oz (3354 g) APGAR:  9, 9  Newborn Delivery   Birth date/time: 05/02/2019 18:48:00 Delivery type: Vaginal, Spontaneous      Baby Feeding: Bottle Disposition:home with mother   05/04/2019 Marikay Alar, CNM

## 2019-06-01 ENCOUNTER — Other Ambulatory Visit: Payer: Self-pay

## 2019-06-01 ENCOUNTER — Encounter (HOSPITAL_COMMUNITY): Payer: Self-pay | Admitting: Emergency Medicine

## 2019-06-01 ENCOUNTER — Emergency Department (HOSPITAL_COMMUNITY)
Admission: EM | Admit: 2019-06-01 | Discharge: 2019-06-01 | Disposition: A | Discharge status: Home / Self Care | Payer: Medicaid Other | Attending: Emergency Medicine | Admitting: Emergency Medicine

## 2019-06-01 ENCOUNTER — Emergency Department (HOSPITAL_COMMUNITY): Payer: Medicaid Other

## 2019-06-01 DIAGNOSIS — Z79899 Other long term (current) drug therapy: Secondary | ICD-10-CM | POA: Diagnosis not present

## 2019-06-01 DIAGNOSIS — Z91010 Allergy to peanuts: Secondary | ICD-10-CM | POA: Diagnosis not present

## 2019-06-01 DIAGNOSIS — R0789 Other chest pain: Secondary | ICD-10-CM | POA: Diagnosis present

## 2019-06-01 LAB — BASIC METABOLIC PANEL
Anion gap: 9 (ref 5–15)
BUN: 7 mg/dL (ref 6–20)
CO2: 23 mmol/L (ref 22–32)
Calcium: 9 mg/dL (ref 8.9–10.3)
Chloride: 108 mmol/L (ref 98–111)
Creatinine, Ser: 0.49 mg/dL (ref 0.44–1.00)
GFR calc Af Amer: >60 mL/min (ref >60)
GFR calc non Af Amer: >60 mL/min (ref >60)
Glucose, Bld: 97 mg/dL (ref 70–99)
Potassium: 3.6 mmol/L (ref 3.5–5.1)
Sodium: 140 mmol/L (ref 135–145)

## 2019-06-01 LAB — CBC
HCT: 33.3 % — ABNORMAL LOW (ref 36.0–46.0)
Hemoglobin: 9.7 g/dL — ABNORMAL LOW (ref 12.0–15.0)
MCH: 22 pg — ABNORMAL LOW (ref 26.0–34.0)
MCHC: 29.1 g/dL — ABNORMAL LOW (ref 30.0–36.0)
MCV: 75.7 fL — ABNORMAL LOW (ref 80.0–100.0)
Platelets: 260 10*3/uL (ref 150–400)
RBC: 4.4 MIL/uL (ref 3.87–5.11)
RDW: 19.4 % — ABNORMAL HIGH (ref 11.5–15.5)
WBC: 5.8 10*3/uL (ref 4.0–10.5)
nRBC: 0 % (ref 0.0–0.2)

## 2019-06-01 LAB — TROPONIN I (HIGH SENSITIVITY)
Troponin I (High Sensitivity): 3 ng/L (ref <18)
Troponin I (High Sensitivity): <2 ng/L (ref <18)

## 2019-06-01 LAB — I-STAT BETA HCG BLOOD, ED (MC, WL, AP ONLY): I-stat hCG, quantitative: <5 m[IU]/mL (ref <5)

## 2019-06-01 MED ORDER — SODIUM CHLORIDE 0.9% FLUSH: 3.0000 mL | Freq: Once | INTRAVENOUS | Status: DC

## 2019-06-01 MED ORDER — IBUPROFEN 800 MG PO TABS: 800.0000 mg | ORAL_TABLET | Freq: Three times a day (TID) | ORAL | 0 refills | Status: DC | PRN

## 2019-06-01 MED ORDER — KETOROLAC TROMETHAMINE 60 MG/2ML IM SOLN
60.0000 mg | Freq: Once | INTRAMUSCULAR | Status: AC
  Administered 2019-06-01: 60 mg via INTRAMUSCULAR
  Filled 2019-06-01: qty 2

## 2019-06-01 NOTE — Discharge Instructions (Signed)
Return here as needed.  Follow-up with a primary doctor.  Use heat over the area that is sore.

## 2019-06-01 NOTE — ED Provider Notes (Signed)
[facility] Provider Note   CSN: [id number] Arrival date & time: 06/01/19  0321     History   Chief Complaint Chief Complaint  Patient presents with  . Chest Pain    HPI Kimberly Odom is a 23 y.o. female.     HPI Patient presents to the emergency department with chest pain that started 3 days ago.  The patient states that movements and palpation and coughing and laughing make the pain worse.  Patient states the pain is been constant.  Does not radiate.  Patient did not take any medications prior to arrival for her symptoms.  The patient denies  shortness of breath, headache,blurred vision, neck pain, fever, cough, weakness, numbness, dizziness, anorexia, edema, abdominal pain, nausea, vomiting, diarrhea, rash, back pain, dysuria, hematemesis, bloody stool, near syncope, or syncope. History reviewed. No pertinent past medical history.  Patient Active Problem List   Diagnosis Date Noted  . Pregnancy 05/02/2019  . Iron deficiency anemia 06/03/2015  . School failure 02/11/2014  . Severe eczema 11/12/2013  . Irregular menstrual cycle 11/12/2013  . Contraceptive management 11/12/2013  . BMI (body mass index), pediatric, less than 5th percentile for age 16/07/2014    History reviewed. No pertinent surgical history.   OB History    Gravida  1   Para  1   Term  1   Preterm      AB      Living  1     SAB      TAB      Ectopic      Multiple  0   Live Births  1            Home Medications    Prior to Admission medications   Medication Sig Start Date End Date Taking? Authorizing Provider  ferrous sulfate 325 (65 FE) MG tablet Take 1 tablet (325 mg total) by mouth 2 (two) times daily with a meal. 05/04/19  Yes Janeece RiggersGreer, Ellis K, CNM  cholecalciferol (VITAMIN D) 1000 UNITS tablet Take 2 tablets (2,000 Units total) by mouth daily. Patient not taking: Reported on 06/01/2019 12/02/13   [place], Katherine, MD  ibuprofen (ADVIL)  600 MG tablet Take 1 tablet (600 mg total) by mouth every 6 (six) hours as needed for moderate pain. Patient not taking: Reported on 06/01/2019 05/04/19   Janeece RiggersGreer, Ellis K, CNM    Family History No family history on file.  Social History Social History   Tobacco Use  . Smoking status: Never Smoker  . Smokeless tobacco: Never Used  Substance Use Topics  . Alcohol use: Not on file  . Drug use: Not on file     Allergies   Shellfish allergy and Peanuts [peanut oil]   Review of Systems Review of Systems All other systems negative except as documented in the HPI. All pertinent positives and negatives as reviewed in the HPI.  Physical Exam Updated Vital Signs BP 103/73   Pulse (!) 59   Temp 98.3 F (36.8 C) (Oral)   Resp (!) 22   Ht 5\' 1"  (1.549 m)   Wt 55.8 kg   SpO2 100%   BMI 23.24 kg/m   Physical Exam Vitals signs and nursing note reviewed.  Constitutional:      General: She is not in acute distress.    Appearance: She is well-developed.  HENT:     Head: Normocephalic and atraumatic.  Eyes:     Pupils: Pupils are equal, round, and reactive  to light.  Neck:     Musculoskeletal: Normal range of motion and neck supple.  Cardiovascular:     Rate and Rhythm: Normal rate and regular rhythm.     Heart sounds: Normal heart sounds. No murmur. No friction rub. No gallop.   Pulmonary:     Effort: Pulmonary effort is normal. No respiratory distress.     Breath sounds: Normal breath sounds. No wheezing.  Chest:     Chest wall: Tenderness present.  Abdominal:     General: Bowel sounds are normal. There is no distension.     Palpations: Abdomen is soft.     Tenderness: There is no abdominal tenderness.  Skin:    General: Skin is warm and dry.     Capillary Refill: Capillary refill takes less than 2 seconds.     Findings: No erythema or rash.  Neurological:     Mental Status: She is alert and oriented to person, place, and time.     Motor: No abnormal muscle tone.      Coordination: Coordination normal.  Psychiatric:        Behavior: Behavior normal.      ED Treatments / Results  Labs (all labs ordered are listed, but only abnormal results are displayed) Labs Reviewed  CBC - Abnormal; Notable for the following components:      Result Value   Hemoglobin 9.7 (*)    HCT 33.3 (*)    MCV 75.7 (*)    MCH 22.0 (*)    MCHC 29.1 (*)    RDW 19.4 (*)    All other components within normal limits  BASIC METABOLIC PANEL  I-STAT BETA HCG BLOOD, ED ([facility], [facility], [facility] ONLY)  TROPONIN I (HIGH SENSITIVITY)  TROPONIN I (HIGH SENSITIVITY)    EKG EKG Interpretation  Date/Time:  Tuesday June 01 2019 03:29:32 EDT Ventricular Rate:  88 PR Interval:  128 QRS Duration: 78 QT Interval:  390 QTC Calculation: 471 R Axis:   88 Text Interpretation:  Normal sinus rhythm Normal ECG No old tracing to compare Confirmed by Merrily Pew [phone]) on 06/01/2019 3:41:48 AM Also confirmed by Merrily Pew [phone]), editor Hattie Perch ([id number])  on 06/01/2019 7:02:05 AM   Radiology Dg Chest 2 View  Result Date: 06/01/2019 CLINICAL DATA:  Chest pain with shortness of breath for 1 week EXAM: CHEST - 2 VIEW COMPARISON:  None. FINDINGS: Normal heart size and mediastinal contours. No acute infiltrate or edema. No effusion or pneumothorax. No acute osseous findings. IMPRESSION: Negative chest. Electronically Signed   By: Monte Fantasia M.D.   On: 06/01/2019 04:20    Procedures Procedures (including critical care time)  Medications Ordered in ED Medications  sodium chloride flush (NS) 0.9 % injection 3 mL (has no administration in time range)  ketorolac (TORADOL) injection 60 mg (60 mg Intramuscular Given 06/01/19 0828)     Initial Impression / Assessment and Plan / ED Course  I have reviewed the triage vital signs and the nursing notes.  Pertinent labs & imaging results that were available during my care of the patient were reviewed by me and considered in my medical decision  making (see chart for details).    Patient has chest wall pain based on her HPI and physical exam findings along with the fact that she has normal laboratory testing here.  Patient is advised of the plan and all questions were answered.  I advised her to return here as needed.    Final Clinical Impressions(s) /  ED Diagnoses   Final diagnoses:  None    ED Discharge Orders    None       Charlestine NightLawyer, Ason Heslin, PA-C 06/01/19 [id number]    Geoffery Lyonselo, Douglas, MD 06/02/19 [id number]

## 2019-06-01 NOTE — ED Triage Notes (Signed)
Patient with chest pain and shortness of breath, mild in nature.  Patient denies any nausea or vomiting at this time.

## 2019-11-22 ENCOUNTER — Ambulatory Visit (HOSPITAL_COMMUNITY)
Admission: EM | Admit: 2019-11-22 | Discharge: 2019-11-22 | Disposition: A | Discharge status: Home / Self Care | Payer: Medicaid Other | Attending: Family Medicine | Admitting: Family Medicine

## 2019-11-22 ENCOUNTER — Encounter (HOSPITAL_COMMUNITY): Payer: Self-pay | Admitting: *Deleted

## 2019-11-22 ENCOUNTER — Other Ambulatory Visit: Payer: Self-pay

## 2019-11-22 DIAGNOSIS — L309 Dermatitis, unspecified: Secondary | ICD-10-CM | POA: Diagnosis not present

## 2019-11-22 HISTORY — DX: Dermatitis, unspecified: L30.9

## 2019-11-22 MED ORDER — FLUOCINONIDE-E 0.05 % EX CREA: 1.0000 "application " | TOPICAL_CREAM | Freq: Two times a day (BID) | CUTANEOUS | 0 refills | Status: DC

## 2019-11-22 MED ORDER — PREDNISONE 10 MG (21) PO TBPK: ORAL_TABLET | Freq: Every day | ORAL | 0 refills | Status: DC

## 2019-11-22 NOTE — Discharge Instructions (Signed)
I have sent for a course of oral steroids to help with your skin as well as an emollient steroid cream which I am hopeful will be helpful.  Use of Aquaphor to affected areas, especially after bathing/showering.  I have sent a referral to our dermatology with our Family medicine clinic where you can also establish care.

## 2019-11-22 NOTE — ED Triage Notes (Addendum)
Patient reports eczema flair up for approx 2 weeks. States that she is hurting everywhere. Patient reports the medications we currently rub on her are not working as well as they were before.   Patient also reports warts to bilateral feet, states they hurt and itch. Noticed about 2 months ago. States she thinks they may be on her hands as well.

## 2019-11-22 NOTE — ED Provider Notes (Signed)
[facility]    CSN: [id number] Arrival date & time: 11/22/19  1345      History   Chief Complaint Chief Complaint  Patient presents with  . Eczema    HPI Kimberly Odom is a 24 y.o. female.   Kimberly Odom presents with complaints of flare/ unmanaged eczema. This is not new for her, she has dealt with eczema lifelong, but has been uncontrolled for weeks now. Itching. Some pain. Has been using triamcinolone and cortisone which haven't helped. Has been using over the counter dry skin cream which hasn't helped. States she was also given cephalexin which she has taken for the past 2 months. Doesn't follow with a PCP or dermatology.    ROS per HPI, negative if not otherwise mentioned.      Past Medical History:  Diagnosis Date  . Eczema     Patient Active Problem List   Diagnosis Date Noted  . Pregnancy 05/02/2019  . Iron deficiency anemia 06/03/2015  . School failure 02/11/2014  . Severe eczema 11/12/2013  . Irregular menstrual cycle 11/12/2013  . Contraceptive management 11/12/2013  . BMI (body mass index), pediatric, less than 5th percentile for age 25/07/2014    History reviewed. No pertinent surgical history.  OB History    Gravida  1   Para  1   Term  1   Preterm      AB      Living  1     SAB      TAB      Ectopic      Multiple  0   Live Births  1            Home Medications    Prior to Admission medications   Medication Sig Start Date End Date Taking? Authorizing Provider  cholecalciferol (VITAMIN D) 1000 UNITS tablet Take 2 tablets (2,000 Units total) by mouth daily. Patient not taking: Reported on 06/01/2019 12/02/13   Martinique, Katherine, MD  ferrous sulfate 325 (65 FE) MG tablet Take 1 tablet (325 mg total) by mouth 2 (two) times daily with a meal. 05/04/19   Marikay Alar, CNM  fluocinonide-emollient (LIDEX-E) 0.05 % cream Apply 1 application topically 2 (two) times daily. 11/22/19   Zigmund Gottron, NP  ibuprofen  (ADVIL) 800 MG tablet Take 1 tablet (800 mg total) by mouth every 8 (eight) hours as needed. 06/01/19   Lawyer, Harrell Gave, PA-C  predniSONE (STERAPRED UNI-PAK 21 TAB) 10 MG (21) TBPK tablet Take by mouth daily. Per box instruction 11/22/19   Zigmund Gottron, NP    Family History Family History  Problem Relation Age of Onset  . Healthy Mother   . Diabetes Father     Social History Social History   Tobacco Use  . Smoking status: Never Smoker  . Smokeless tobacco: Never Used  Substance Use Topics  . Alcohol use: Not on file  . Drug use: Not on file     Allergies   Shellfish allergy, Cinnamon, and Peanuts [peanut oil]   Review of Systems Review of Systems   Physical Exam Triage Vital Signs ED Triage Vitals  Enc Vitals Group     BP 11/22/19 1547 116/78     Pulse Rate 11/22/19 1547 75     Resp 11/22/19 1547 16     Temp 11/22/19 1547 98 F (36.7 C)     Temp Source 11/22/19 1547 Oral     SpO2 11/22/19 1547 100 %  Weight --      Height --      Head Circumference --      Peak Flow --      Pain Score 11/22/19 1543 10     Pain Loc --      Pain Edu? --      Excl. in GC? --    No data found.  Updated Vital Signs BP 116/78 (BP Location: Right Arm)   Pulse 75   Temp 98 F (36.7 C) (Oral)   Resp 16   LMP 10/25/2019   SpO2 100%   Visual Acuity Right Eye Distance:   Left Eye Distance:   Bilateral Distance:    Right Eye Near:   Left Eye Near:    Bilateral Near:     Physical Exam Constitutional:      General: She is not in acute distress.    Appearance: She is well-developed.  Cardiovascular:     Rate and Rhythm: Normal rate.  Pulmonary:     Effort: Pulmonary effort is normal.  Skin:    General: Skin is warm and dry.     Comments: Significant scaly, dry, some fissures, plaquing to skin- arms, hands, chest noted   Neurological:     Mental Status: She is alert and oriented to person, place, and time.      UC Treatments / Results  Labs (all labs  ordered are listed, but only abnormal results are displayed) Labs Reviewed - No data to display  EKG   Radiology No results found.  Procedures Procedures (including critical care time)  Medications Ordered in UC Medications - No data to display  Initial Impression / Assessment and Plan / UC Course  I have reviewed the triage vital signs and the nursing notes.  Pertinent labs & imaging results that were available during my care of the patient were reviewed by me and considered in my medical decision making (see chart for details).     Extensive eczema. Skin care discussed. Did opt to provide oral steroids to try to help better manage. Topical treatments discussed and provided as well. Referral to family medicine dermatology. Encouraged to establish with a PCP as well. Patient verbalized understanding and agreeable to plan.   Final Clinical Impressions(s) / UC Diagnoses   Final diagnoses:  Severe eczema     Discharge Instructions     I have sent for a course of oral steroids to help with your skin as well as an emollient steroid cream which I am hopeful will be helpful.  Use of Aquaphor to affected areas, especially after bathing/showering.  I have sent a referral to our dermatology with our Family medicine clinic where you can also establish care.    ED Prescriptions    Medication Sig Dispense Auth. Provider   fluocinonide-emollient (LIDEX-E) 0.05 % cream Apply 1 application topically 2 (two) times daily. 60 g Linus Mako B, NP   predniSONE (STERAPRED UNI-PAK 21 TAB) 10 MG (21) TBPK tablet Take by mouth daily. Per box instruction 21 tablet Georgetta Haber, NP     PDMP not reviewed this encounter.   Georgetta Haber, NP 11/22/19 1922

## 2019-12-13 ENCOUNTER — Ambulatory Visit
Admit: 2019-12-13 | Discharge: 2019-12-14 | Payer: MEDICAID | Attending: Student in an Organized Health Care Education/Training Program | Primary: Student in an Organized Health Care Education/Training Program

## 2019-12-13 DIAGNOSIS — Z79899 Other long term (current) drug therapy: Principal | ICD-10-CM

## 2019-12-13 DIAGNOSIS — L2084 Intrinsic (allergic) eczema: Principal | ICD-10-CM

## 2019-12-13 MED ORDER — TRIAMCINOLONE ACETONIDE 0.1 % TOPICAL OINTMENT
3 refills | 0 days | Status: CP
Start: 2019-12-13 — End: ?

## 2019-12-13 MED ORDER — DOXYCYCLINE HYCLATE 100 MG TABLET
ORAL_TABLET | 0 refills | 0 days | Status: CP
Start: 2019-12-13 — End: ?

## 2019-12-13 MED ORDER — CLOBETASOL 0.05 % TOPICAL OINTMENT
1 refills | 0 days | Status: CP
Start: 2019-12-13 — End: ?

## 2020-01-04 ENCOUNTER — Ambulatory Visit: Admit: 2020-01-04 | Discharge: 2020-01-05 | Payer: MEDICAID

## 2020-02-01 DIAGNOSIS — L2084 Intrinsic (allergic) eczema: Principal | ICD-10-CM

## 2020-02-01 MED ORDER — DUPILUMAB 300 MG/2 ML SUBCUTANEOUS PEN INJECTOR
SUBCUTANEOUS | 2 refills | 0.00000 days | Status: CP
Start: 2020-02-01 — End: ?
  Filled 2020-02-08: qty 4, 14d supply, fill #0

## 2020-02-03 NOTE — Unmapped (Signed)
[facility] Specialty Medication Onboarding    Specialty Medication: DUPIXENT PENS 300MG /2ML LOADING DOSE AND MAINTENANCE DOSE  Prior Authorization: Approved   Financial Assistance: No - copay  <$25  Final Copay/Day Supply: $3 / 14 DAYS FOR LOADING DOSE AND 28 DAYS FOR MAINTENANCE DOSE    Insurance Restrictions: Yes - max 1 month supply     Notes to Pharmacist:     The triage team has completed the benefits investigation and has determined that the patient is able to fill this medication at [facility]. Please contact the patient to complete the onboarding or follow up with the prescribing physician as needed.

## 2020-02-07 MED ORDER — EMPTY CONTAINER
2 refills | 0 days
Start: 2020-02-07 — End: ?

## 2020-02-07 NOTE — Unmapped (Signed)
[facility]   Patient Onboarding/Medication Counseling    Sherry Zhang is a 24 y.o. female with atopic dermatitis who I am counseling today on initiation of therapy.  I am speaking to the patient.    Was a Nurse, learning disability used for this call? No    Verified patient's date of birth / HIPAA.    Specialty medication(s) to be sent: Inflammatory Disorders: Dupixent      Non-specialty medications/supplies to be sent: sharps kit      Medications not needed at this time: na         Dupixent (dupilumab)    Medication & Administration     Dosage: Atopic dermatitis: Inject 600mg  under the skin as a loading dose followed by 300mg  every 14 days thereafter    Administration:     Dupixent Pen  1. Gather all supplies needed for injection on a clean, flat working surface: medication syringe removed from packaging, alcohol swab, sharps container, etc.  2. Look at the medication label - look for correct medication, correct dose, and check the expiration date  3. Look at the medication - the liquid in the pen should appear clear and colorless to pale yellow  4. Lay the pen on a flat surface and allow it to warm up to room temperature for at least 45 minutes  5. Select injection site - you can use the front of your thigh or your belly (but not the area 2 inches around your belly button); if someone else is giving you the injection you can also use your upper arm in the skin covering your triceps muscle  6. Prepare injection site - wash your hands and clean the skin at the injection site with an alcohol swab and let it air dry, do not touch the injection site again before the injection  7. Hold the middle of the body of the pen and gently pull the needle safety cap straight out. Be careful not to bend the needle. Do not remove until immediately prior to injection  8. Press the pen down onto the injection site at a 90 degree angle.   9. You will hear a click as the injection starts, and then a second click when the injection is ALMOST done. Keep holding the pen against the skin for 5 more seconds after the second click.   10. Check that the pen is empty by looking in the viewing window - the yellow indicator bar should be stopped, and should fill the window.   11. Remove the pen from the skin by lifting straight up.   12. Dispose of the used pen immediately in your sharps disposal container  13. If you see any blood at the injection site, press a cotton ball or gauze on the site and maintain pressure until the bleeding stops, do not rub the injection site    Adherence/Missed dose instructions:  If a dose is missed, administer within 7 days from the missed dose and then resume the original schedule. If the missed dose is not administered within 7 days, you can either wait until the next dose on the original schedule or take your dose now and resume every 14 days from the new injection date. Do not use 2 doses at the same time or extra doses.      Goals of Therapy     -Reduce symptoms of pruritus and dermatitis  -Prevent exacerbations  -Minimize therapeutic risks  -Avoidance of long-term systemic and topical glucocorticoid use  -  Maintenance of effective psychosocial functioning    Side Effects & Monitoring Parameters     ? Injection site reaction (redness, irritation, inflammation localized to the site of administration)  ? Signs of a common cold ??? minor sore throat, runny or stuffy nose, etc.  ? Recurrence of cold sores (herpes simplex)      The following side effects should be reported to the provider:  ? Signs of a hypersensitivity reaction ??? rash; hives; itching; red, swollen, blistered, or peeling skin; wheezing; tightness in the chest or throat; difficulty breathing, swallowing, or talking; swelling of the mouth, face, lips, tongue, or throat; etc.  ? Eye pain or irritation or any visual disturbances  ? Shortness of breath or worsening of breathing      Contraindications, Warnings, & Precautions     ? Have your bloodwork checked as you have been told by your prescriber   ? Birth control pills and other hormone-based birth control may not work as well to prevent pregnancy  ? Talk with your doctor if you are pregnant, planning to become pregnant, or breastfeeding  ? Discuss the possible need for holding your dose(s) of Dupixent?? when a planned procedure is scheduled with the prescriber as it may delay healing/recovery timeline       Drug/Food Interactions     ? Medication list reviewed in Epic. The patient was instructed to inform the care team before taking any new medications or supplements. No drug interactions identified.   ? Talk with you prescriber or pharmacist before receiving any live vaccinations while taking this medication and after you stop taking it    Storage, Handling Precautions, & Disposal     ? Store this medication in the refrigerator.  Do not freeze  ? If needed, you may store at room temperature for up to 14 days  ? Store in [employer], protected from light  ? Do not shake  ? Dispose of used syringes in a sharps disposal container              Current Medications (including OTC/herbals), Comorbidities and Allergies     Current Outpatient Medications   Medication Sig Dispense Refill   ??? AUROVELA FE 1-20, 28, 1 mg-20 mcg (21)/75 mg (7) per tablet Take 1 tablet by mouth daily.     ??? clobetasoL (TEMOVATE) 0.05 % ointment Apply to the affected areas of your hands and feet twice daily 60 g 1   ??? doxycycline (VIBRA-TABS) 100 MG tablet Take one tablet by mouth twice daily for 14 days. Take with food and water. 28 tablet 0   ??? dupilumab 300 mg/2 mL PnIj Inject the contents of 2 pens (600mg ) under the skin once for initial dose, then inject the contents of 1 pen (300mg ) under the skin once every other week. 4 mL 2   ??? empty container Misc Use as directed to dispose of Dupixent pens. 1 each 2   ??? predniSONE (DELTASONE) 10 mg tablet pack Take by mouth.     ??? triamcinolone (KENALOG) 0.1 % ointment Apply to the affected areas of the skin twice daily 453.6 g 3     No current facility-administered medications for this visit.        Allergies   Allergen Reactions   ??? Shellfish Containing Products Other (See Comments) and Swelling     THROAT SWELLS/ CAN'T BREATHE   ??? Peanut Oil    ??? Cinnamon Itching       There is no problem  list on file for this patient.      Reviewed and up to date in Epic.    Appropriateness of Therapy     Is medication and dose appropriate based on diagnosis? Yes    Prescription has been clinically reviewed: Yes    Baseline Quality of Life Assessment      How many days over the past month did your AD  keep you from your normal activities? For example, brushing your teeth or getting up in the morning. 0    Financial Information     Medication Assistance provided: Prior Authorization    Anticipated copay of $3 reviewed with patient. Verified delivery address.    Delivery Information     Scheduled delivery date: Wed, April 7 for load, then Tues, April 20 for maintenance    Expected start date: Wed, April 7    Medication will be delivered via UPS to the prescription address in [place].  This shipment will not require a signature.      Explained the services we provide at [facility] and that each month we would call to set up refills.  Stressed importance of returning phone calls so that we could ensure they receive their medications in time each month.  Informed patient that we should be setting up refills 7-10 days prior to when they will run out of medication.  A pharmacist will reach out to perform a clinical assessment periodically.  Informed patient that a welcome packet and a drug information handout will be sent.      Patient verbalized understanding of the above information as well as how to contact the pharmacy at [phone] option 4 with any questions/concerns.  The pharmacy is open Monday through Friday 8:30am-4:30pm.  A pharmacist is available 24/7 via pager to answer any clinical questions they may have.    Patient Specific Needs     ? Does the patient have any physical, cognitive, or cultural barriers? No    ? Patient prefers to have medications discussed with  Patient     ? Is the patient or caregiver able to read and understand education materials at a high school level or above? Yes    ? Patient's primary language is  English     ? Is the patient high risk? No     ? Does the patient require a Care Management Plan? No     ? Does the patient require physician intervention or other additional services (i.e. nutrition, smoking cessation, social work)? No      Sherry Zhang A Desiree Lucy [facility] Specialty Pharmacist

## 2020-02-08 MED FILL — EMPTY CONTAINER: 120 days supply | Qty: 1 | Fill #0 | Status: AC

## 2020-02-08 MED FILL — EMPTY CONTAINER: 120 days supply | Qty: 1 | Fill #0

## 2020-02-08 MED FILL — DUPIXENT 300 MG/2 ML SUBCUTANEOUS PEN INJECTOR: 14 days supply | Qty: 4 | Fill #0 | Status: AC

## 2020-02-10 NOTE — Unmapped (Signed)
[facility] Pharmacist Intervention    Type of intervention: Pen misfire    Medication: Dupixent    Problem: Sherry Zhang called in to the pharmacy to let us know she lifted the pen off her skin too soon with each pen, and much of the liquid squirted out of the pens.     Intervention: I re-reviewed instructions to hold pen down for ~ 10 seconds and to check the window before lifting the pen off her skin.     I'll verify with provider that they'd like to repeat loading doses - it seems she got some medication, but it was hard for me to quantify. I did check with insurance, and they will allow another loading dose to be filled with an insurance override.     Follow up needed: YES, need to call patient back once we have new order to be able to schedule.    Approximate time spent: 15 minutes    Tahjae Durr A Desiree Lucy [facility] Specialty Pharmacist

## 2020-02-12 MED ORDER — DUPIXENT 300 MG/2 ML SUBCUTANEOUS PEN INJECTOR
0 refills | 0 days
Start: 2020-02-12 — End: ?

## 2020-02-14 NOTE — Unmapped (Signed)
[facility] Pharmacist Intervention    Type of intervention: Redo loading dose due to pen misfire    Medication: Dupixent    Problem: Yaire needed a new rx to readminister loading dose     Intervention: a new rx was sent over by MD for loading dose and it will be delivered on 02/16/20.  Encouraged patient to call us with any questions while administering new pen.    Follow up needed: will call her next week to check about loading dose and schedule next    Approximate time spent: 10 minutes    Kaylianna Detert Vangie Bicker   [facility] Specialty Pharmacist

## 2020-02-15 MED FILL — DUPIXENT 300 MG/2 ML SUBCUTANEOUS PEN INJECTOR: 14 days supply | Qty: 4 | Fill #0

## 2020-02-15 MED FILL — DUPIXENT 300 MG/2 ML SUBCUTANEOUS PEN INJECTOR: 28 days supply | Qty: 4 | Fill #0 | Status: AC

## 2020-02-18 DIAGNOSIS — L2084 Intrinsic (allergic) eczema: Principal | ICD-10-CM

## 2020-02-18 MED ORDER — CLOBETASOL 0.05 % TOPICAL OINTMENT
OPHTHALMIC | 1 refills | 0.00000 days | Status: CP
Start: 2020-02-18 — End: ?

## 2020-02-22 NOTE — Unmapped (Signed)
Sherry Zhang reports her re-load of Dupixent went well. She has noticed some peeling of her feet, which we discussed can be normal as the older skin is pushed off and replaced by undamaged skin. I recommended she notify clinic for painful peeling/new cracks/bleeding from this, as this could pose risk for secondary infection.       [facility] Clinical Assessment & Refill Coordination Note    Azzie Thiem, DOB: [birth year]  Phone: [phone] (home)     All above HIPAA information was verified with patient.     Was a Nurse, learning disability used for this call? No    Specialty Medication(s):   Inflammatory Disorders: Dupixent     Current Outpatient Medications   Medication Sig Dispense Refill   ??? AUROVELA FE 1-20, 28, 1 mg-20 mcg (21)/75 mg (7) per tablet Take 1 tablet by mouth daily.     ??? clobetasoL (TEMOVATE) 0.05 % ointment Apply to the affected areas of your hands and feet twice daily 60 g 1   ??? doxycycline (VIBRA-TABS) 100 MG tablet Take one tablet by mouth twice daily for 14 days. Take with food and water. 28 tablet 0   ??? dupilumab (DUPIXENT PEN) 300 mg/2 mL PnIj Inject the contents of 2 pens (600 mg) under the skin to repeat loading dose. 4 mL 0   ??? dupilumab 300 mg/2 mL PnIj Inject the contents of 2 pens (600mg ) under the skin once for initial dose, then inject the contents of 1 pen (300mg ) under the skin once every other week. 4 mL 2   ??? empty container Misc Use as directed to dispose of Dupixent pens. 1 each 2   ??? predniSONE (DELTASONE) 10 mg tablet pack Take by mouth.     ??? triamcinolone (KENALOG) 0.1 % ointment Apply to the affected areas of the skin twice daily 453.6 g 3     No current facility-administered medications for this visit.        Changes to medications: Sharona reports no changes at this time.    Allergies   Allergen Reactions   ??? Shellfish Containing Products Other (See Comments) and Swelling     THROAT SWELLS/ CAN'T BREATHE   ??? Peanut Oil    ??? Cinnamon Itching Changes to allergies: No    SPECIALTY MEDICATION ADHERENCE     Dupixent - 0 left  Medication Adherence    Patient reported X missed doses in the last month: 0  Specialty Medication: Dupixent  Patient is on additional specialty medications: No          Specialty medication(s) dose(s) confirmed: Regimen is correct and unchanged.     Are there any concerns with adherence? No    Adherence counseling provided? Not needed    CLINICAL MANAGEMENT AND INTERVENTION      Clinical Benefit Assessment:    Do you feel the medicine is effective or helping your condition? not yet, very early into process    Clinical Benefit counseling provided? Reasonable expectations discussed: can take several weeks to see improvement in skin    Adverse Effects Assessment:    Are you experiencing any side effects? No    Are you experiencing difficulty administering your medicine? No    Quality of Life Assessment:    How many days over the past month did your AD  keep you from your normal activities? For example, brushing your teeth or getting up in the morning. 0    Have you discussed this with your provider?  Not needed    Therapy Appropriateness:    Is therapy appropriate? Yes, therapy is appropriate and should be continued    DISEASE/MEDICATION-SPECIFIC INFORMATION      For patients on injectable medications: Patient currently has 0 doses left.  Next injection is scheduled for Thurs, April 29.    PATIENT SPECIFIC NEEDS     - Does the patient have any physical, cognitive, or cultural barriers? No    - Is the patient high risk? No     - Does the patient require a Care Management Plan? No     - Does the patient require physician intervention or other additional services (i.e. nutrition, smoking cessation, social work)? No      SHIPPING     Specialty Medication(s) to be Shipped:   Inflammatory Disorders: Dupixent    Other medication(s) to be shipped: na     Changes to insurance: No    Delivery Scheduled: Yes, Expected medication delivery date: Tues, April 27.     Medication will be delivered via UPS to the confirmed prescription address in [facility].    The patient will receive a drug information handout for each medication shipped and additional FDA Medication Guides as required.  Verified that patient has previously received a Conservation officer, historic buildings.    All of the patient's questions and concerns have been addressed.    Lanney Gins   [facility] Specialty Pharmacist

## 2020-02-28 MED FILL — DUPIXENT 300 MG/2 ML SUBCUTANEOUS PEN INJECTOR: 28 days supply | Qty: 4 | Fill #1 | Status: AC

## 2020-02-28 MED FILL — DUPIXENT 300 MG/2 ML SUBCUTANEOUS PEN INJECTOR: 28 days supply | Qty: 4 | Fill #1

## 2020-03-02 NOTE — Unmapped (Signed)
Error

## 2020-03-02 NOTE — Unmapped (Signed)
I called Ms. Riggenbach to see how things were going with her injection.  I had gotten the message that she had a misfire and was redoing the loading.  Hopefully this is now going well but her phone went straight to voicemail.  I left a message asking her to let me know if she is having any issues.  Otherwise she has an appointment with me on 5/11.

## 2020-03-13 NOTE — Unmapped (Signed)
DERMATOLOGY CLINIC NOTE    ASSESSMENT AND PLAN:     Severe atopic dermatitis, significantly improved on Dupixent:  -Continue Dupixent.  -Continue topical medications as needed.  -She discussed she is otherwise feeling well.  She believes her weight loss was due to uncontrolled skin rash.  Hopefully now that she has her skin under better control she can more easily maintain weight.     Return to clinic:  4 months for recheck.       CHIEF COMPLAINT:  Atopic dermatitis     HPI:   This is a pleasant 24 y.o. female who last saw me on 01/04/2020.  At that time she was seen for bad atopic dermatitis which has been a longstanding issue for her.  We had asked her to get labs at last visit and they were essentially normal, specifically she had a negative examination for HIV, normal transaminases, negative QuantiFERON, negative testing for hepatitis.  The decision was made at that visit to start Dupixent.  She had some issues initially with misfirings but is now on the medication and returns for follow-up.    She returns today for follow-up.  Her skin is much improved.  She is tolerating the Dupixent well.  She uses the topicals as needed.     PAST MEDICAL HISTORY:  Pt otherwise healthy.     MEDICATIONS:   Current Outpatient Medications on File Prior to Visit   Medication Sig Dispense Refill   ??? AUROVELA FE 1-20, 28, 1 mg-20 mcg (21)/75 mg (7) per tablet Take 1 tablet by mouth daily.     ??? clobetasoL (TEMOVATE) 0.05 % ointment Apply to the affected areas of your hands and feet twice daily 60 g 1   ??? doxycycline (VIBRA-TABS) 100 MG tablet Take one tablet by mouth twice daily for 14 days. Take with food and water. 28 tablet 0   ??? dupilumab (DUPIXENT PEN) 300 mg/2 mL PnIj Inject the contents of 2 pens (600 mg) under the skin to repeat loading dose. 4 mL 0   ??? dupilumab 300 mg/2 mL PnIj Inject the contents of 2 pens (600mg ) under the skin once for initial dose, then inject the contents of 1 pen (300mg ) under the skin once every other week. 4 mL 2   ??? empty container Misc Use as directed to dispose of Dupixent pens. 1 each 2   ??? predniSONE (DELTASONE) 10 mg tablet pack Take by mouth.     ??? triamcinolone (KENALOG) 0.1 % ointment Apply to the affected areas of the skin twice daily 453.6 g 3     No current facility-administered medications on file prior to visit.       ALLERGIES:   Shellfish  Peanut oil    SOCIAL HISTORY:  Adopted    REVIEW OF SYSTEMS:  Baseline state of health. No recent illnesses. No other skin complaints.     PHYSICAL EXAMINATION:  Examination in the presence of female chaperone:  General: Well-developed, well-nourished. No acute distress.   Neuro: Alert and oriented, answers questions appropriately.  Skin: Examination of the scalp, face, head, neck, chest, back, upper extremities, lower extremities, hands, palms, soles was performed and notable for the following:  Thin eczematous patches and small papules on bilateral upper extremities, lower extremities  Diffuse xerosis     Dictation software was used while making this note. Please excuse any errors made with dictation software.

## 2020-03-14 ENCOUNTER — Ambulatory Visit: Admit: 2020-03-14 | Discharge: 2020-03-15 | Payer: MEDICAID

## 2020-03-14 DIAGNOSIS — L2084 Intrinsic (allergic) eczema: Principal | ICD-10-CM

## 2020-03-21 NOTE — Unmapped (Signed)
[facility] Specialty Pharmacy Refill Coordination Note    Specialty Medication(s) to be Shipped:   Inflammatory Disorders: Dupixent    Other medication(s) to be shipped: none     [name], DOB: Nov 26, 1995  Phone: [phone] (home)       All above HIPAA information was verified with patient.     Was a Nurse, learning disability used for this call? No    Completed refill call assessment today to schedule patient's medication shipment from the [facility] [phone]).       Specialty medication(s) and dose(s) confirmed: Regimen is correct and unchanged.   Changes to medications: Jalen reports no changes at this time.  Changes to insurance: No  Questions for the pharmacist: No    Confirmed patient received Welcome Packet with first shipment. The patient will receive a drug information handout for each medication shipped and additional FDA Medication Guides as required.       DISEASE/MEDICATION-SPECIFIC INFORMATION        N/A    SPECIALTY MEDICATION ADHERENCE     Medication Adherence    Patient reported X missed doses in the last month: 0  Specialty Medication: Dupixent 300 mg/2 ml  Patient is on additional specialty medications: No          DUPIXENT: 0 days worth of medication on hand.        SHIPPING     Shipping address confirmed in Epic.     Delivery Scheduled: Yes, Expected medication delivery date: 03/29/20.     Medication will be delivered via UPS to the prescription address in Epic WAM.    [place] A Jermiya Reichl   [facility] Shared [facility] Specialty Technician

## 2020-03-28 MED FILL — DUPIXENT 300 MG/2 ML SUBCUTANEOUS PEN INJECTOR: 28 days supply | Qty: 4 | Fill #2 | Status: AC

## 2020-03-28 MED FILL — DUPIXENT 300 MG/2 ML SUBCUTANEOUS PEN INJECTOR: 28 days supply | Qty: 4 | Fill #2

## 2020-04-18 DIAGNOSIS — L2084 Intrinsic (allergic) eczema: Principal | ICD-10-CM

## 2020-04-18 MED ORDER — CLOBETASOL 0.05 % TOPICAL OINTMENT
4 refills | 0 days | Status: CP
Start: 2020-04-18 — End: ?

## 2020-04-18 MED ORDER — TRIAMCINOLONE ACETONIDE 0.1 % TOPICAL OINTMENT
3 refills | 0 days | Status: CP
Start: 2020-04-18 — End: ?

## 2020-04-19 DIAGNOSIS — L2084 Intrinsic (allergic) eczema: Principal | ICD-10-CM

## 2020-04-19 MED ORDER — DUPILUMAB 300 MG/2 ML SUBCUTANEOUS PEN INJECTOR
SUBCUTANEOUS | 6 refills | 0.00000 days | Status: CP
Start: 2020-04-19 — End: ?
  Filled 2020-04-20: qty 4, 28d supply, fill #0

## 2020-04-19 MED ORDER — DUPIXENT 300 MG/2 ML SUBCUTANEOUS PEN INJECTOR
0 refills | 0.00000 days | Status: CN
Start: 2020-04-19 — End: ?

## 2020-04-19 NOTE — Unmapped (Signed)
[facility] Specialty Pharmacy Refill Coordination Note    Specialty Medication(s) to be Shipped:   Inflammatory Disorders: Dupixent    Other medication(s) to be shipped: none     [name], DOB: 1996/02/27  Phone: [phone] (home)       All above HIPAA information was verified with patient.     Was a Nurse, learning disability used for this call? No    Completed refill call assessment today to schedule patient's medication shipment from the [facility] [phone]).       Specialty medication(s) and dose(s) confirmed: Regimen is correct and unchanged.   Changes to medications: Heylee reports no changes at this time.  Changes to insurance: No  Questions for the pharmacist: No    Confirmed patient received Welcome Packet with first shipment. The patient will receive a drug information handout for each medication shipped and additional FDA Medication Guides as required.       DISEASE/MEDICATION-SPECIFIC INFORMATION        For patients on injectable medications: Patient currently has 0 doses left.  Next injection is scheduled for 04/25/20.    SPECIALTY MEDICATION ADHERENCE     Medication Adherence    Patient reported X missed doses in the last month: 0  Specialty Medication: Dupixent  Patient is on additional specialty medications: No                Dupixent 300/2 mg/ml: 0 days of medicine on hand           SHIPPING     Shipping address confirmed in Epic.     Delivery Scheduled: Yes, Expected medication delivery date: 04/20/20.  However, Rx request for refills was sent to the provider as there are none remaining.     Medication will be delivered via UPS to the prescription address in Epic WAM.    Unk Lightning   [facility] Specialty Technician

## 2020-04-19 NOTE — Unmapped (Signed)
Same medication requested in a different message

## 2020-04-19 NOTE — Unmapped (Signed)
Refill for Dupixent pended. LOV- 03/14/20. Thanks!

## 2020-04-20 MED FILL — DUPIXENT 300 MG/2 ML SUBCUTANEOUS PEN INJECTOR: 28 days supply | Qty: 4 | Fill #0 | Status: AC

## 2020-05-12 NOTE — Unmapped (Signed)
[facility] Specialty Pharmacy Refill Coordination Note    Specialty Medication(s) to be Shipped:   Inflammatory Disorders: Dupixent    Other medication(s) to be shipped: none     Sherry Zhang, DOB: September 26, 1996  Phone: [phone] (home)       All above HIPAA information was verified with patient.     Was a Nurse, learning disability used for this call? No    Completed refill call assessment today to schedule patient's medication shipment from the [facility] [phone]).       Specialty medication(s) and dose(s) confirmed: Regimen is correct and unchanged.   Changes to medications: Mandalyn reports no changes at this time.  Changes to insurance: No  Questions for the pharmacist: No    Confirmed patient received Welcome Packet with first shipment. The patient will receive a drug information handout for each medication shipped and additional FDA Medication Guides as required.       DISEASE/MEDICATION-SPECIFIC INFORMATION        For patients on injectable medications: Patient currently has 1 doses left.  Next injection is scheduled for 05/18/20.    SPECIALTY MEDICATION ADHERENCE     Medication Adherence    Patient reported X missed doses in the last month: 0  Specialty Medication: Dupixent  Patient is on additional specialty medications: No                Dupixent 300/2 mg/ml: 6 days of medicine on hand           SHIPPING     Shipping address confirmed in Epic.     Delivery Scheduled: Yes, Expected medication delivery date: 05/18/20.     Medication will be delivered via UPS to the prescription address in Epic WAM.    Unk Lightning   [facility] Specialty Technician

## 2020-05-17 MED FILL — DUPIXENT 300 MG/2 ML SUBCUTANEOUS PEN INJECTOR: SUBCUTANEOUS | 28 days supply | Qty: 4 | Fill #1

## 2020-05-17 MED FILL — DUPIXENT 300 MG/2 ML SUBCUTANEOUS PEN INJECTOR: 28 days supply | Qty: 4 | Fill #1 | Status: AC

## 2020-06-20 NOTE — Unmapped (Signed)
[facility] Specialty Pharmacy Refill Coordination Note    Specialty Medication(s) to be Shipped:   Inflammatory Disorders: Dupixent    Other medication(s) to be shipped: none     Sherry Zhang, DOB: [birth year]  Phone: [phone] (home)       All above HIPAA information was verified with patient.     Was a Nurse, learning disability used for this call? No    Completed refill call assessment today to schedule patient's medication shipment from the [facility] [phone]).       Specialty medication(s) and dose(s) confirmed: Regimen is correct and unchanged.   Changes to medications: Sherry Zhang reports no changes at this time.  Changes to insurance: No  Questions for the pharmacist: No    Confirmed patient received Welcome Packet with first shipment. The patient will receive a drug information handout for each medication shipped and additional FDA Medication Guides as required.       DISEASE/MEDICATION-SPECIFIC INFORMATION        For patients on injectable medications: Patient currently has 1 doses left.  Next injection is scheduled for 06/29/2020.    SPECIALTY MEDICATION ADHERENCE     Medication Adherence    Patient reported X missed doses in the last month: 0  Specialty Medication: Dupixent 300 mg/2 ml   Patient is on additional specialty medications: No  Any gaps in refill history greater than 2 weeks in the last 3 months: no  Demonstrates understanding of importance of adherence: yes  Informant: patient  Reliability of informant: reliable  Confirmed plan for next specialty medication refill: delivery by pharmacy  Refills needed for supportive medications: not needed                Dupixent 300/2 mg/ml: 14 days of medicine on hand           SHIPPING     Shipping address confirmed in Epic.     Delivery Scheduled: Yes, Expected medication delivery date: 06/27/2020.     Medication will be delivered via UPS to the prescription address in Epic WAM.    Sherry Zhang   [facility] Shared [facility] Specialty Technician

## 2020-06-26 MED FILL — DUPIXENT 300 MG/2 ML SUBCUTANEOUS PEN INJECTOR: 28 days supply | Qty: 4 | Fill #2 | Status: AC

## 2020-06-26 MED FILL — DUPIXENT 300 MG/2 ML SUBCUTANEOUS PEN INJECTOR: SUBCUTANEOUS | 28 days supply | Qty: 4 | Fill #2

## 2020-07-20 NOTE — Unmapped (Signed)
[facility] Shared [facility] Specialty Pharmacy Clinical Assessment & Refill Coordination Note    Sherry Zhang, DOB: 1996/04/07  Phone: [phone] (home)     All above HIPAA information was verified with patient.     Was a Nurse, learning disability used for this call? No    Specialty Medication(s):   Inflammatory Disorders: Dupixent     Current Outpatient Medications   Medication Sig Dispense Refill   ??? AUROVELA FE 1-20, 28, 1 mg-20 mcg (21)/75 mg (7) per tablet Take 1 tablet by mouth daily.     ??? clobetasoL (TEMOVATE) 0.05 % ointment Apply to the affected areas of your hands and feet twice daily 60 g 4   ??? doxycycline (VIBRA-TABS) 100 MG tablet Take one tablet by mouth twice daily for 14 days. Take with food and water. 28 tablet 0   ??? dupilumab (DUPIXENT PEN) 300 mg/2 mL PnIj Inject the contents of 2 pens (600 mg) under the skin to repeat loading dose. 4 mL 0   ??? dupilumab 300 mg/2 mL PnIj Inject the contents of 1 pen (300 mg) under the skin every fourteen (14) days. 4 mL 6   ??? empty container Misc Use as directed to dispose of Dupixent pens. 1 each 2   ??? predniSONE (DELTASONE) 10 mg tablet pack Take by mouth.     ??? triamcinolone (KENALOG) 0.1 % ointment Apply to the affected areas of the skin twice daily 453.6 g 3     No current facility-administered medications for this visit.        Changes to medications: Raeden reports no changes at this time.    Allergies   Allergen Reactions   ??? Shellfish Containing Products Other (See Comments) and Swelling     THROAT SWELLS/ CAN'T BREATHE   ??? Peanut Oil    ??? Cinnamon Itching       Changes to allergies: No    SPECIALTY MEDICATION ADHERENCE     Dupixent 300/2 mg/ml: 10 days of medicine on hand       Medication Adherence    Patient reported X missed doses in the last month: 0  Specialty Medication: Dupixent 300 mg/2 ml  Patient is on additional specialty medications: No  Informant: patient  Confirmed plan for next specialty medication refill: delivery by pharmacy  Refills needed for supportive medications: not needed          Specialty medication(s) dose(s) confirmed: Regimen is correct and unchanged.     Are there any concerns with adherence? No    Adherence counseling provided? Not needed    CLINICAL MANAGEMENT AND INTERVENTION      Clinical Benefit Assessment:    Do you feel the medicine is effective or helping your condition? Yes    Clinical Benefit counseling provided? Not needed    Adverse Effects Assessment:    Are you experiencing any side effects? No    Are you experiencing difficulty administering your medicine? No    Quality of Life Assessment:    How many days over the past month did your AD  keep you from your normal activities? For example, brushing your teeth or getting up in the morning. 0    Have you discussed this with your provider? Not needed    Therapy Appropriateness:    Is therapy appropriate? Yes, therapy is appropriate and should be continued    DISEASE/MEDICATION-SPECIFIC INFORMATION      For patients on injectable medications: Patient currently has 0 doses left.  Next injection is scheduled  for 07/30/20.    PATIENT SPECIFIC NEEDS     - Does the patient have any physical, cognitive, or cultural barriers? No    - Is the patient high risk? No    - Does the patient require a Care Management Plan? No     - Does the patient require physician intervention or other additional services (i.e. nutrition, smoking cessation, social work)? No      SHIPPING     Specialty Medication(s) to be Shipped:   Inflammatory Disorders: Dupixent    Other medication(s) to be shipped: No additional medications requested for fill at this time     Changes to insurance: No    Delivery Scheduled: Yes, Expected medication delivery date: 07/26/20.     Medication will be delivered via UPS to the confirmed prescription address in [facility].    The patient will receive a drug information handout for each medication shipped and additional FDA Medication Guides as required.  Verified that patient has previously received a Conservation officer, historic buildings.    All of the patient's questions and concerns have been addressed.    Graison Leinberger Vangie Bicker   [facility] Shared [facility] Specialty Pharmacist

## 2020-07-25 MED FILL — DUPIXENT 300 MG/2 ML SUBCUTANEOUS PEN INJECTOR: SUBCUTANEOUS | 28 days supply | Qty: 4 | Fill #3

## 2020-07-25 MED FILL — DUPIXENT 300 MG/2 ML SUBCUTANEOUS PEN INJECTOR: 28 days supply | Qty: 4 | Fill #3 | Status: AC

## 2020-08-18 DIAGNOSIS — L2084 Intrinsic (allergic) eczema: Principal | ICD-10-CM

## 2020-08-18 NOTE — Unmapped (Signed)
[facility] Specialty Pharmacy Refill Coordination Note    Specialty Medication(s) to be Shipped:   Inflammatory Disorders: Dupixent    Other medication(s) to be shipped: none     Sherry Zhang, DOB: [birth year]  Phone: [phone] (home)       All above HIPAA information was verified with patient.     Was a Nurse, learning disability used for this call? No    Completed refill call assessment today to schedule patient's medication shipment from the [facility] [phone]).       Specialty medication(s) and dose(s) confirmed: Regimen is correct and unchanged.   Changes to medications: Tacori reports no changes at this time.  Changes to insurance: No  Questions for the pharmacist: No    Confirmed patient received Welcome Packet with first shipment. The patient will receive a drug information handout for each medication shipped and additional FDA Medication Guides as required.       DISEASE/MEDICATION-SPECIFIC INFORMATION        For patients on injectable medications: Patient currently has 1 doses left.  Next injection is scheduled for 08/20/2020.    SPECIALTY MEDICATION ADHERENCE     Medication Adherence    Patient reported X missed doses in the last month: 0  Specialty Medication: Dupixent 300 mg/2 ml   Patient is on additional specialty medications: No  Any gaps in refill history greater than 2 weeks in the last 3 months: no  Demonstrates understanding of importance of adherence: yes  Informant: patient  Reliability of informant: reliable  Confirmed plan for next specialty medication refill: delivery by pharmacy  Refills needed for supportive medications: not needed                Dupixent 300/2 mg/ml: 14 days of medicine on hand           SHIPPING     Shipping address confirmed in Epic.     Delivery Scheduled: Yes, Expected medication delivery date: 08/24/2020.     Medication will be delivered via UPS to the prescription address in Epic WAM.    Beecher Furio D Rajanae Mantia   [facility] Shared [facility] Specialty Technician

## 2020-08-22 NOTE — Unmapped (Signed)
08/22/2020 Dupixent PA denied. MAPs informed Clinic POCs.  - [place] Managed Medicaid. LM for patient, cancelled work request and updated epic documentation. KFR

## 2020-09-04 NOTE — Unmapped (Signed)
Medication delivery rescheduled for 11/3

## 2020-09-04 NOTE — Unmapped (Signed)
Sherry Zhang 's Dupixent shipment will be delayed as a result of prior authorization now approved.     I have reached out to the patient and left a voicemail message.  We will wait for a call back from the patient to reschedule the delivery.  We have not confirmed the new delivery date.

## 2020-09-05 MED FILL — DUPIXENT 300 MG/2 ML SUBCUTANEOUS PEN INJECTOR: SUBCUTANEOUS | 28 days supply | Qty: 4 | Fill #4

## 2020-09-05 MED FILL — DUPIXENT 300 MG/2 ML SUBCUTANEOUS PEN INJECTOR: 28 days supply | Qty: 4 | Fill #4 | Status: AC

## 2020-09-20 NOTE — Unmapped (Signed)
[facility] Specialty Pharmacy Refill Coordination Note    Specialty Medication(s) to be Shipped:   Inflammatory Disorders: Dupixent    Other medication(s) to be shipped: No additional medications requested for fill at this time     Sherry Zhang, DOB: 1996-09-20  Phone: [phone] (home)       All above HIPAA information was verified with patient.     Was a Nurse, learning disability used for this call? No    Completed refill call assessment today to schedule patient's medication shipment from the [facility] [phone]).       Specialty medication(s) and dose(s) confirmed: Regimen is correct and unchanged.   Changes to medications: Sherry Zhang reports no changes at this time.  Changes to insurance: No  Questions for the pharmacist: No    Confirmed patient received Welcome Packet with first shipment. The patient will receive a drug information handout for each medication shipped and additional FDA Medication Guides as required.       DISEASE/MEDICATION-SPECIFIC INFORMATION        For patients on injectable medications: Patient currently has 2 doses left.  Next injection is scheduled for 09/24/2020.    SPECIALTY MEDICATION ADHERENCE     Medication Adherence    Patient reported X missed doses in the last month: 0  Specialty Medication: Dupixent 300 mg/2 ml   Patient is on additional specialty medications: No  Any gaps in refill history greater than 2 weeks in the last 3 months: no  Demonstrates understanding of importance of adherence: yes  Informant: patient  Reliability of informant: reliable  Confirmed plan for next specialty medication refill: delivery by pharmacy  Refills needed for supportive medications: not needed                      SHIPPING     Shipping address confirmed in Epic.     Delivery Scheduled: Yes, Expected medication delivery date: 10/04/2020.     Medication will be delivered via UPS to the prescription address in Epic WAM.    Sherry Zhang Sherry Zhang   [facility] Shared [facility] Pharm[facility] Specialty Technician

## 2020-10-03 MED FILL — DUPIXENT 300 MG/2 ML SUBCUTANEOUS PEN INJECTOR: SUBCUTANEOUS | 28 days supply | Qty: 4 | Fill #5

## 2020-10-03 MED FILL — DUPIXENT 300 MG/2 ML SUBCUTANEOUS PEN INJECTOR: 28 days supply | Qty: 4 | Fill #5 | Status: AC

## 2020-10-23 NOTE — Unmapped (Signed)
[facility] Specialty Pharmacy Refill Coordination Note    Specialty Medication(s) to be Shipped:   Inflammatory Disorders: Dupixent    Other medication(s) to be shipped: No additional medications requested for fill at this time     Sherry Zhang, DOB: 03-20-1996  Phone: [phone] (home)       All above HIPAA information was verified with patient.     Was a Nurse, learning disability used for this call? No    Completed refill call assessment today to schedule patient's medication shipment from the [facility] [phone]).       Specialty medication(s) and dose(s) confirmed: Regimen is correct and unchanged.   Changes to medications: [name] reports no changes at this time.  Changes to insurance: No  Questions for the pharmacist: No    Confirmed patient received Welcome Packet with first shipment. The patient will receive a drug information handout for each medication shipped and additional FDA Medication Guides as required.       DISEASE/MEDICATION-SPECIFIC INFORMATION        For patients on injectable medications: Patient currently has 1 doses left.  Next injection is scheduled for 11/06/2019.    SPECIALTY MEDICATION ADHERENCE     Medication Adherence    Patient reported X missed doses in the last month: 0  Specialty Medication: Dupixent 300 mg/2 ml  Patient is on additional specialty medications: No  Any gaps in refill history greater than 2 weeks in the last 3 months: no  Demonstrates understanding of importance of adherence: yes  Informant: patient  Reliability of informant: reliable  Confirmed plan for next specialty medication refill: delivery by pharmacy  Refills needed for supportive medications: not needed                      SHIPPING     Shipping address confirmed in Epic.     Delivery Scheduled: Yes, Expected medication delivery date: 11/01/2020.     Medication will be delivered via UPS to the prescription address in Epic WAM.    Zai Chmiel D Desiree Fleming   [facility] Shared [facility] Specialty Technician

## 2020-10-31 MED FILL — DUPIXENT 300 MG/2 ML SUBCUTANEOUS PEN INJECTOR: SUBCUTANEOUS | 28 days supply | Qty: 4 | Fill #6

## 2020-10-31 MED FILL — DUPIXENT 300 MG/2 ML SUBCUTANEOUS PEN INJECTOR: 28 days supply | Qty: 4 | Fill #6 | Status: AC

## 2020-11-23 DIAGNOSIS — L2084 Intrinsic (allergic) eczema: Principal | ICD-10-CM

## 2020-11-23 MED ORDER — DUPIXENT 300 MG/2 ML SUBCUTANEOUS PEN INJECTOR
SUBCUTANEOUS | 6 refills | 28 days
Start: 2020-11-23 — End: ?

## 2020-11-24 MED ORDER — DUPIXENT 300 MG/2 ML SUBCUTANEOUS PEN INJECTOR
SUBCUTANEOUS | 2 refills | 28 days | Status: CP
Start: 2020-11-24 — End: ?
  Filled 2020-12-07: qty 4, 28d supply, fill #0

## 2020-11-24 NOTE — Unmapped (Signed)
Refill sent for Dupixent.  Patient needs a follow-up.

## 2020-11-24 NOTE — Unmapped (Signed)
Called, left voicemail    Thanks!

## 2020-11-30 NOTE — Unmapped (Signed)
[facility] Pharm[facility] Refill Coordination Note    Specialty Medication(s) to be Shipped:   Inflammatory Disorders: Dupixent     Other medication(s) to be shipped: No additional medications requested for fill at this time     Sherry Zhang, DOB: 1996-01-08  Phone: [phone] (home)       All above HIPAA information was verified with patient.     Was a Nurse, learning disability used for this call? No    Completed refill call assessment today to schedule patient's medication shipment from the [facility] [phone]).       Specialty medication(s) and dose(s) confirmed: Regimen is correct and unchanged.   Changes to medications: Jadi reports no changes at this time.  Changes to insurance: No  Questions for the pharmacist: No    Confirmed patient received Welcome Packet with first shipment. The patient will receive a drug information handout for each medication shipped and additional FDA Medication Guides as required.       DISEASE/MEDICATION-SPECIFIC INFORMATION        For patients on injectable medications: Patient currently has 1 doses left.  Next injection is scheduled for 12/03/2020.    SPECIALTY MEDICATION ADHERENCE     Medication Adherence    Patient reported X missed doses in the last month: 0  Specialty Medication: Dupixent 300 mg/2 ml  Patient is on additional specialty medications: No          SHIPPING     Shipping address confirmed in Epic.     Delivery Scheduled: Yes, Expected medication delivery date: 12/08/2020.     Medication will be delivered via UPS to the prescription address in Epic WAM.    Lorelei Pont [facility] Pharm[facility] Specialty Technician

## 2021-01-03 NOTE — Unmapped (Signed)
Sherry Zhang reports her skin is clear, and she's had no new flares. She does occasionally use topicals PRN, but has a good supply.     I reminded her that we've got 2 refills left on her order, and it looks like she'd need an appointment for more - she let me know she'd like to establish care closer to home. I advised she could call around to some local offices near [place], and that she'd likely need a referral. I advised she could reach out to clinic via MyChart to let them know where to send a referral, and her records could be faxed.     [facility] Clinical Assessment & Refill Coordination Note    Sherry Zhang, DOB: 1996/09/07  Phone: [phone] (home)     All above HIPAA information was verified with patient.     Was a Nurse, learning disability used for this call? No    Specialty Medication(s):   Inflammatory Disorders: Dupixent     Current Outpatient Medications   Medication Sig Dispense Refill   ??? AUROVELA FE 1-20, 28, 1 mg-20 mcg (21)/75 mg (7) per tablet Take 1 tablet by mouth daily.     ??? clobetasoL (TEMOVATE) 0.05 % ointment Apply to the affected areas of your hands and feet twice daily 60 g 4   ??? doxycycline (VIBRA-TABS) 100 MG tablet Take one tablet by mouth twice daily for 14 days. Take with food and water. 28 tablet 0   ??? dupilumab (DUPIXENT PEN) 300 mg/2 mL PnIj Inject the contents of 2 pens (600 mg) under the skin to repeat loading dose. 4 mL 0   ??? dupilumab (DUPIXENT PEN) 300 mg/2 mL PnIj Inject the contents of 1 pen (300 mg) under the skin every fourteen (14) days. 4 mL 2   ??? empty container Misc Use as directed to dispose of Dupixent pens. 1 each 2   ??? predniSONE (DELTASONE) 10 mg tablet pack Take by mouth.     ??? triamcinolone (KENALOG) 0.1 % ointment Apply to the affected areas of the skin twice daily 453.6 g 3     No current facility-administered medications for this visit.        Changes to medications: Sherry Zhang reports no changes at this time.    Allergies   Allergen Reactions   ??? Shellfish Containing Products Other (See Comments) and Swelling     THROAT SWELLS/ CAN'T BREATHE   ??? Peanut Oil    ??? Cinnamon Itching       Changes to allergies: No    SPECIALTY MEDICATION ADHERENCE     Dupixent 300/2 mg/ml: 1 dose left  Medication Adherence    Patient reported X missed doses in the last month: 0  Specialty Medication: Dupixent  Patient is on additional specialty medications: No          Specialty medication(s) dose(s) confirmed: Regimen is correct and unchanged.     Are there any concerns with adherence? No    Adherence counseling provided? Not needed    CLINICAL MANAGEMENT AND INTERVENTION      Clinical Benefit Assessment:    Do you feel the medicine is effective or helping your condition? Yes    Clinical Benefit counseling provided? Not needed    Adverse Effects Assessment:    Are you experiencing any side effects? No    Are you experiencing difficulty administering your medicine? No    Quality of Life Assessment:    How many days over the past month  did your AD  keep you from your normal activities? For example, brushing your teeth or getting up in the morning. 0    Have you discussed this with your provider? Not needed    Therapy Appropriateness:    Is therapy appropriate? Yes, therapy is appropriate and should be continued    DISEASE/MEDICATION-SPECIFIC INFORMATION      For patients on injectable medications: Patient currently has 1 doses left.  Next injection is scheduled for 3/6.    PATIENT SPECIFIC NEEDS     - Does the patient have any physical, cognitive, or cultural barriers? No    - Is the patient high risk? No    - Does the patient require a Care Management Plan? No     - Does the patient require physician intervention or other additional services (i.e. nutrition, smoking cessation, social work)? No      SHIPPING     Specialty Medication(s) to be Shipped:   Inflammatory Disorders: Dupixent    Other medication(s) to be shipped: No additional medications requested for fill at this time     Changes to insurance: No    Delivery Scheduled: Yes, Expected medication delivery date: 3/9.     Medication will be delivered via UPS to the confirmed prescription address in [facility].    The patient will receive a drug information handout for each medication shipped and additional FDA Medication Guides as required.  Verified that patient has previously received a Conservation officer, historic buildings.    All of the patient's questions and concerns have been addressed.    Lanney Gins   [facility] Specialty Pharmacist

## 2021-01-09 MED FILL — DUPIXENT 300 MG/2 ML SUBCUTANEOUS PEN INJECTOR: SUBCUTANEOUS | 28 days supply | Qty: 4 | Fill #1

## 2021-01-11 IMAGING — DX CHEST - 2 VIEW
2 series · 2 of 2 positions shown · non-contrast
Comparison: None.

CLINICAL DATA: Chest pain with shortness of breath for 1 week

EXAM:
CHEST - 2 VIEW

[chest pa]
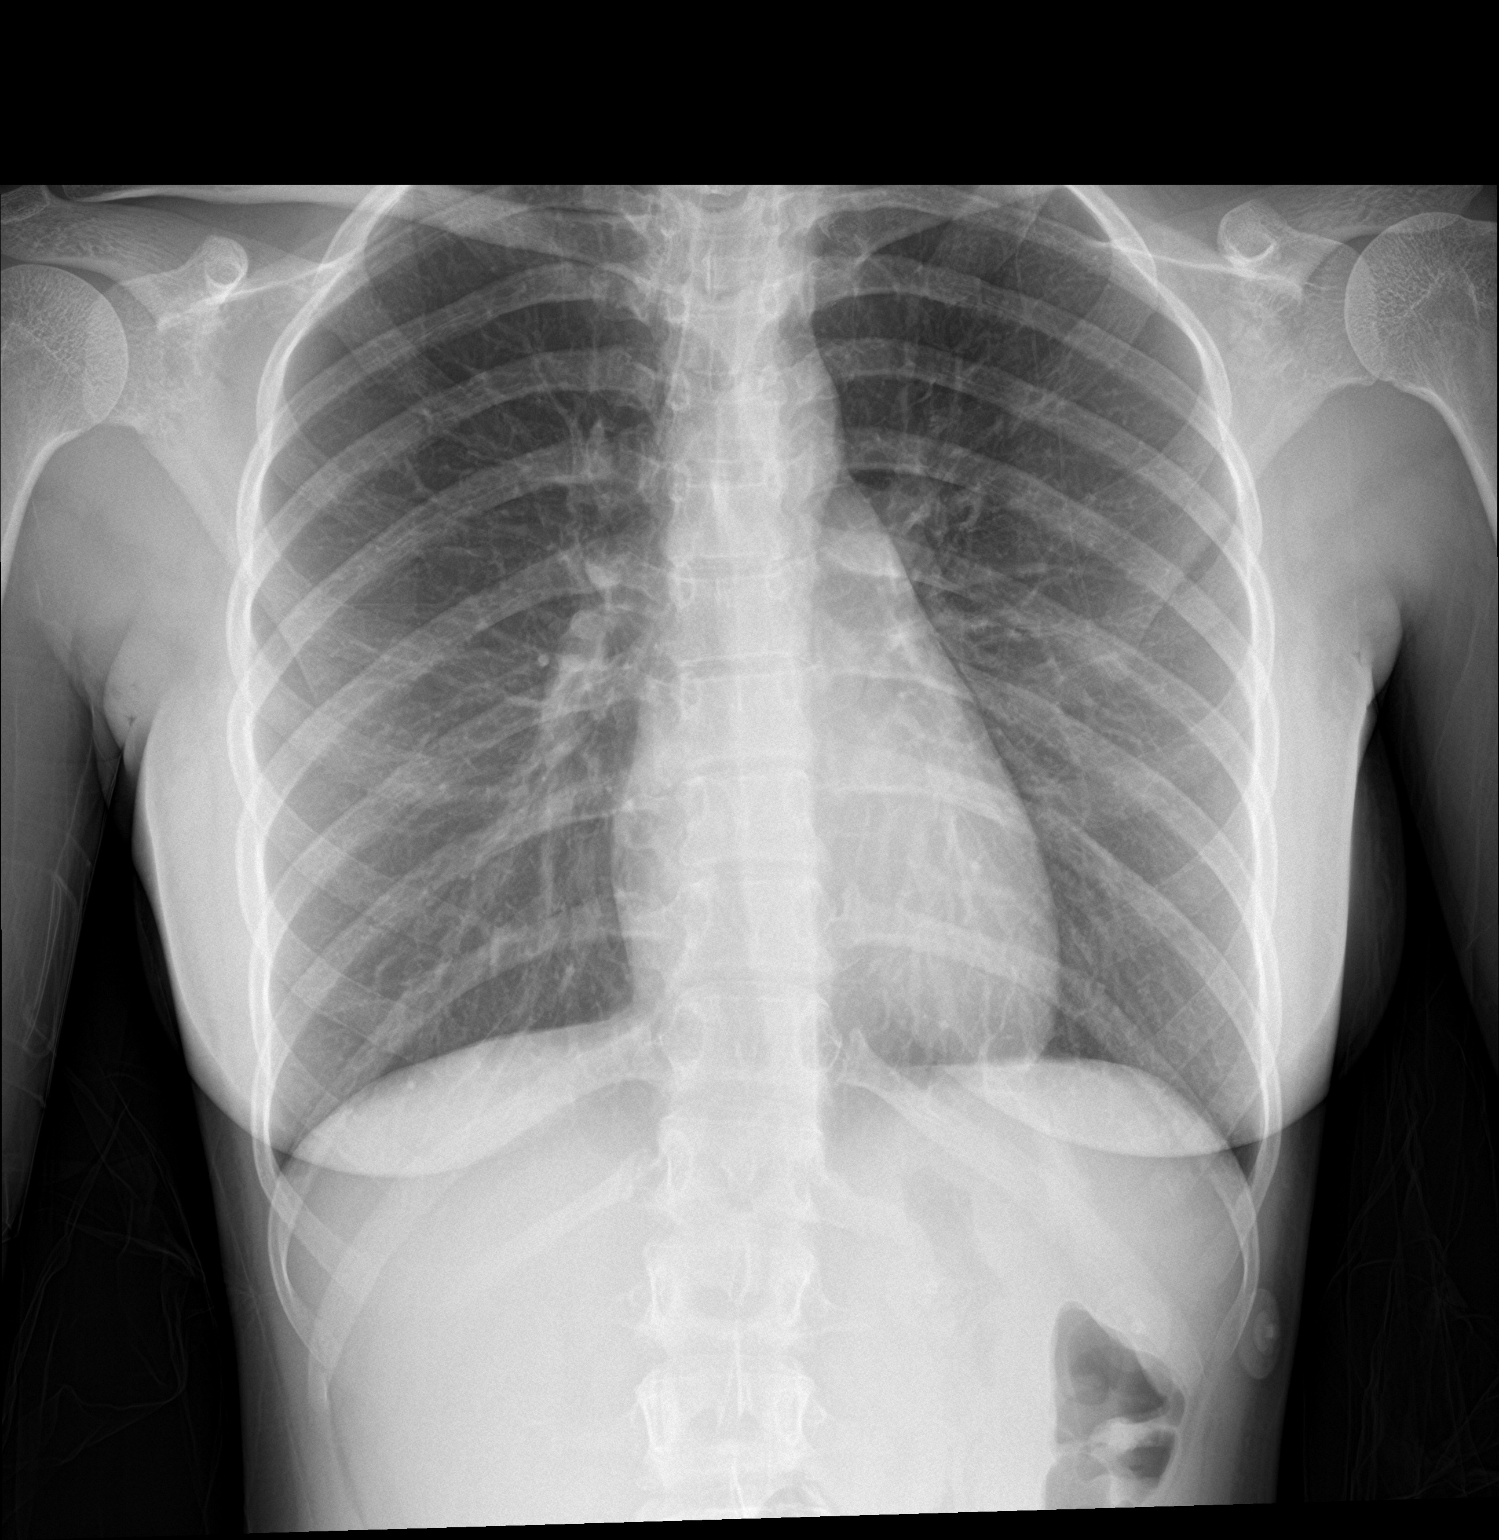

[chest lat]
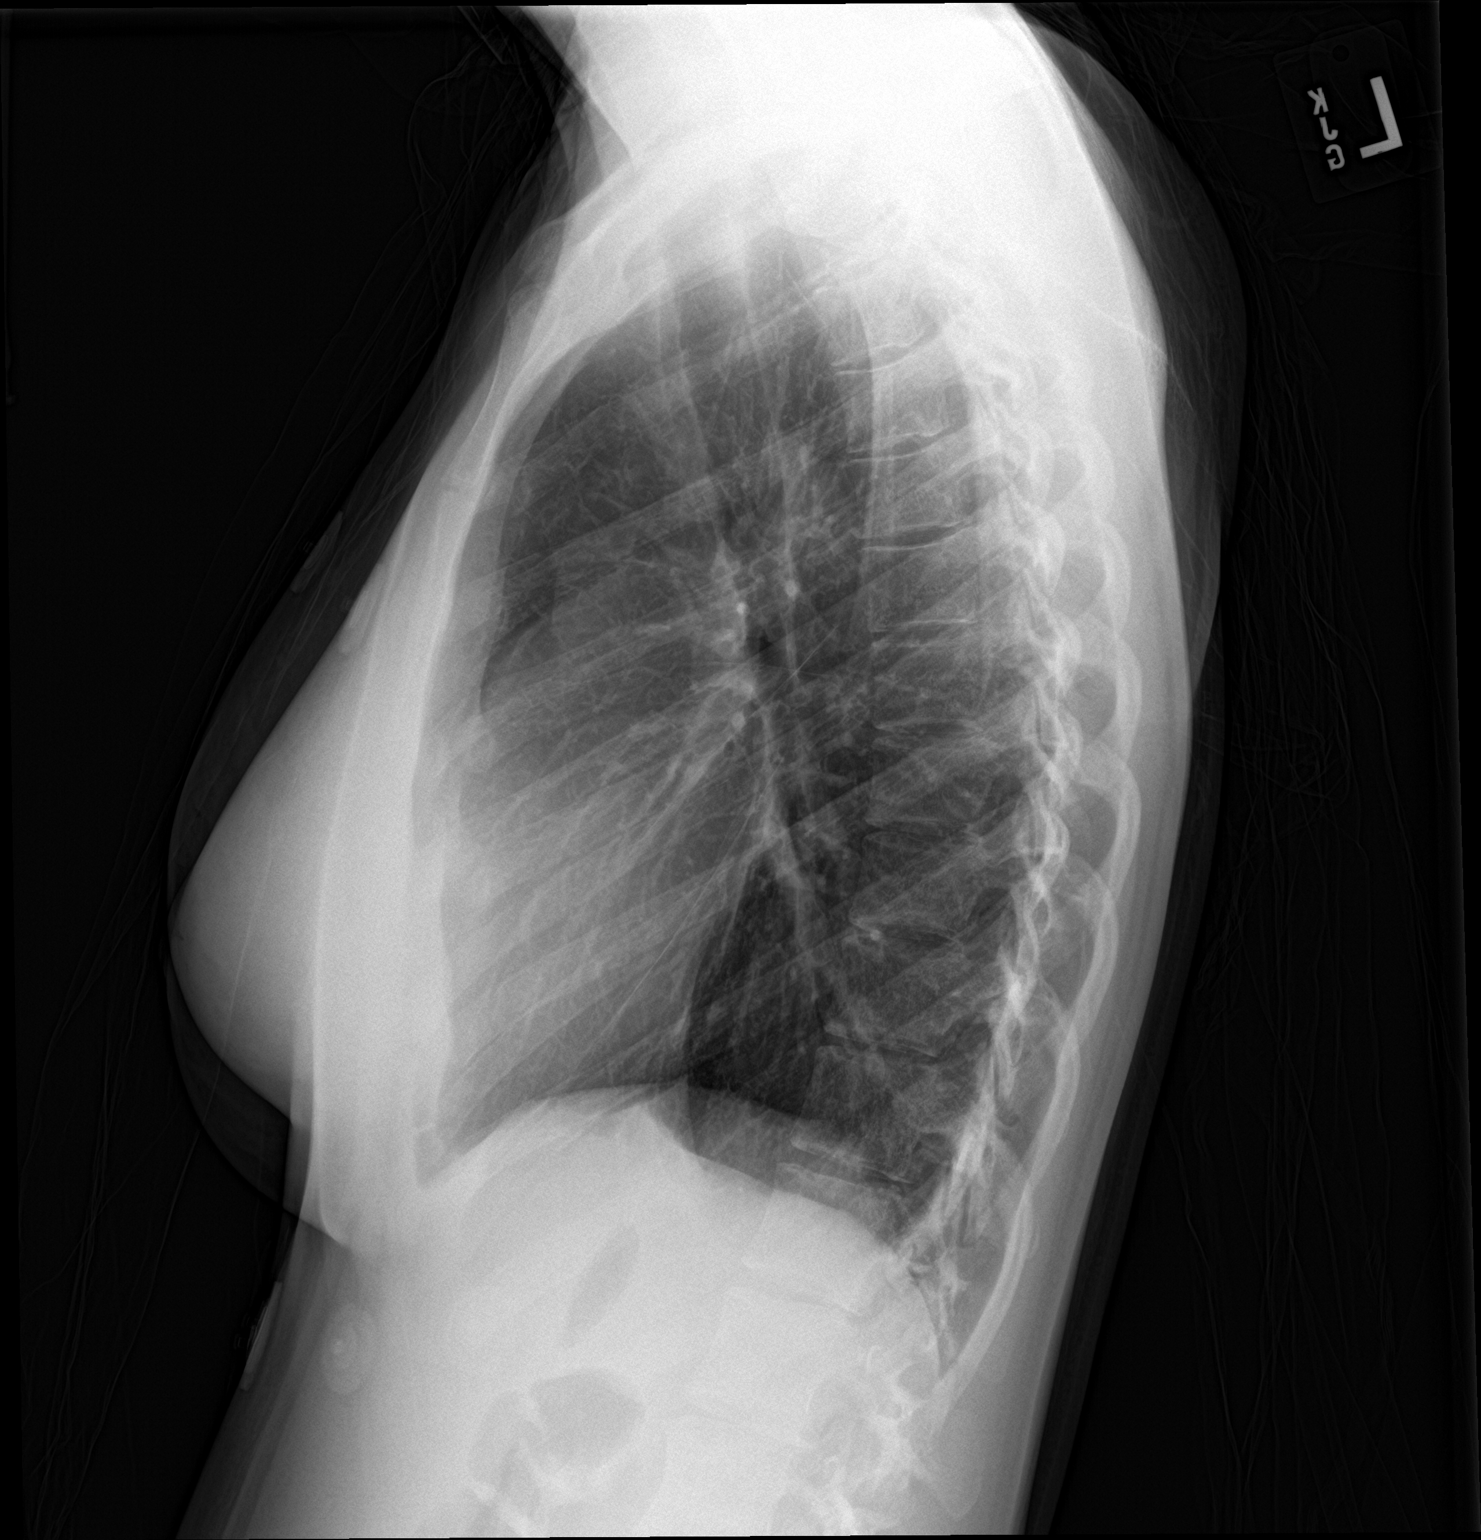

[2 of 2 positions shown; findings below may reference images not displayed]

FINDINGS: Normal heart size and mediastinal contours. No acute infiltrate or
edema. No effusion or pneumothorax. No acute osseous findings.
IMPRESSION: Negative chest.

## 2021-02-07 NOTE — Unmapped (Signed)
[facility] Refill Coordination Note    Specialty Medication(s) to be Shipped:   Inflammatory Disorders: Dupixent    Other medication(s) to be shipped: No additional medications requested for fill at this time     Sherry Zhang, DOB: 01-Sep-1996  Phone: [phone] (home)       All above HIPAA information was verified with patient.     Was a Nurse, learning disability used for this call? No    Completed refill call assessment today to schedule patient's medication shipment from the [facility] [phone]).       Specialty medication(s) and dose(s) confirmed: Regimen is correct and unchanged.   Changes to medications: Swathi reports no changes at this time.  Changes to insurance: No  Questions for the pharmacist: No    Confirmed patient received a Conservation officer, historic buildings and a Surveyor, mining with first shipment. The patient will receive a drug information handout for each medication shipped and additional FDA Medication Guides as required.       DISEASE/MEDICATION-SPECIFIC INFORMATION        For patients on injectable medications: Patient currently has 0 doses left.  Next injection is scheduled for 04/18.    SPECIALTY MEDICATION ADHERENCE     Medication Adherence    Patient reported X missed doses in the last month: 0  Specialty Medication: dupixent 300mg /93ml  Patient is on additional specialty medications: No  Patient is on more than two specialty medications: No  Any gaps in refill history greater than 2 weeks in the last 3 months: no  Demonstrates understanding of importance of adherence: yes  Informant: patient  Reliability of informant: reliable  Provider-estimated medication adherence level: good  Patient is at risk for Non-Adherence: No                  dupixent 300/2 mg/ml: 0 days of medicine on hand       SHIPPING     Shipping address confirmed in Epic.     Delivery Scheduled: Yes, Expected medication delivery date: 04/13.     Medication will be delivered via UPS to the prescription address in Epic [facility].    Antonietta Barcelona   [facility] Specialty Technician

## 2021-02-13 MED FILL — DUPIXENT 300 MG/2 ML SUBCUTANEOUS PEN INJECTOR: SUBCUTANEOUS | 28 days supply | Qty: 4 | Fill #2

## 2021-03-08 DIAGNOSIS — L2084 Intrinsic (allergic) eczema: Principal | ICD-10-CM

## 2021-03-08 MED ORDER — DUPIXENT 300 MG/2 ML SUBCUTANEOUS PEN INJECTOR
SUBCUTANEOUS | 2 refills | 28 days
Start: 2021-03-08 — End: ?

## 2021-03-15 NOTE — Unmapped (Signed)
Refill request denied due to patient needs appointment. Called and spoke to patient to inform. I provided her with clinic phone number to make an appointment.

## 2021-05-30 NOTE — Unmapped (Signed)
Specialty Medication(s): Dupixent    Sherry Zhang has been dis-enrolled from the [facility] specialty pharmacy services due to no active prescription - patient needs to be seen in clinic for more refills. patient notified, and clinic also reached out..    Additional information provided to the patient: na    Kattie Santoyo A Desiree [facility] Specialty Pharmacist

## 2021-07-11 DIAGNOSIS — L2084 Intrinsic (allergic) eczema: Principal | ICD-10-CM

## 2021-07-11 MED ORDER — CLOBETASOL 0.05 % TOPICAL OINTMENT
4 refills | 0 days
Start: 2021-07-11 — End: ?

## 2022-08-20 ENCOUNTER — Other Ambulatory Visit: Payer: Self-pay | Admitting: Nurse Practitioner

## 2022-08-20 DIAGNOSIS — N6310 Unspecified lump in the right breast, unspecified quadrant: Secondary | ICD-10-CM

## 2022-08-28 ENCOUNTER — Ambulatory Visit
Admission: RE | Admit: 2022-08-28 | Discharge: 2022-08-28 | Disposition: A | Discharge status: Home / Self Care | Payer: Medicaid Other | Source: Ambulatory Visit | Attending: Nurse Practitioner | Admitting: Nurse Practitioner

## 2022-08-28 ENCOUNTER — Other Ambulatory Visit: Payer: Self-pay | Admitting: Nurse Practitioner

## 2022-08-28 DIAGNOSIS — N6310 Unspecified lump in the right breast, unspecified quadrant: Secondary | ICD-10-CM

## 2022-11-12 ENCOUNTER — Ambulatory Visit: Payer: Medicaid Other | Admitting: Nurse Practitioner

## 2023-02-07 ENCOUNTER — Ambulatory Visit: Payer: Medicaid Other | Admitting: Nurse Practitioner

## 2023-02-11 ENCOUNTER — Ambulatory Visit: Payer: Medicaid Other | Admitting: Nurse Practitioner

## 2023-02-19 ENCOUNTER — Other Ambulatory Visit: Payer: Self-pay | Admitting: Nurse Practitioner

## 2023-02-19 ENCOUNTER — Ambulatory Visit
Admission: RE | Admit: 2023-02-19 | Discharge: 2023-02-19 | Disposition: A | Discharge status: Home / Self Care | Payer: Medicaid Other | Source: Ambulatory Visit | Attending: Nurse Practitioner | Admitting: Nurse Practitioner

## 2023-02-19 DIAGNOSIS — N6310 Unspecified lump in the right breast, unspecified quadrant: Secondary | ICD-10-CM

## 2023-03-11 ENCOUNTER — Ambulatory Visit (INDEPENDENT_AMBULATORY_CARE_PROVIDER_SITE_OTHER): Payer: 59 | Admitting: Nurse Practitioner

## 2023-03-11 ENCOUNTER — Encounter: Payer: Self-pay | Admitting: Nurse Practitioner

## 2023-03-11 VITALS — BP 107/65 | HR 83 | Temp 98.0°F | Ht 62.0 in | Wt 93.6 lb

## 2023-03-11 DIAGNOSIS — D509 Iron deficiency anemia, unspecified: Secondary | ICD-10-CM

## 2023-03-11 DIAGNOSIS — Z1329 Encounter for screening for other suspected endocrine disorder: Secondary | ICD-10-CM | POA: Diagnosis not present

## 2023-03-11 DIAGNOSIS — H547 Unspecified visual loss: Secondary | ICD-10-CM

## 2023-03-11 DIAGNOSIS — Z0000 Encounter for general adult medical examination without abnormal findings: Secondary | ICD-10-CM

## 2023-03-11 DIAGNOSIS — L2084 Intrinsic (allergic) eczema: Secondary | ICD-10-CM | POA: Diagnosis not present

## 2023-03-11 DIAGNOSIS — Z1321 Encounter for screening for nutritional disorder: Secondary | ICD-10-CM

## 2023-03-11 DIAGNOSIS — Z130 Encounter for screening for diseases of the blood and blood-forming organs and certain disorders involving the immune mechanism: Secondary | ICD-10-CM

## 2023-03-11 DIAGNOSIS — Z13228 Encounter for screening for other metabolic disorders: Secondary | ICD-10-CM

## 2023-03-11 NOTE — Assessment & Plan Note (Signed)
She is followed by dermatology, on [place] presents 300 mg injection every 2 weeks Patient encouraged to maintain close follow-up with dermatologist

## 2023-03-11 NOTE — Progress Notes (Signed)
Complete physical exam  Patient: Kimberly Odom   DOB: 1996/05/09   26 y.o. Female  MRN: [id number]  Subjective:    Chief Complaint  Patient presents with   Establish Care   Annual Exam    Kimberly Odom is a 27 y.o. female with past medical history of iron deficiency anemia, severe eczema who presents today for a complete physical exam. She reports consuming a general diet. The patient does not participate in regular exercise at present. She generally feels well. She reports sleeping well. She  have not additional problems to discuss today.   Last PAP at the helath department in 2023, will send records  Takes ferrous sulfate 325 mg once daily for anemia, has regular but heavy menstruation every now and then.   Most recent fall risk assessment:    03/11/2023    3:19 PM  Fall Risk   Falls in the past year? 0  Number falls in past yr: 0  Injury with Fall? 0  Risk for fall due to : No Fall Risks  Follow up Falls evaluation completed     Most recent depression screenings:    03/11/2023    3:21 PM  PHQ 2/9 Scores  PHQ - 2 Score 0  PHQ- 9 Score 0        Patient Care Team: Donell Beers, FNP as PCP - General (Nurse Practitioner)   Outpatient Medications Prior to Visit  Medication Sig Note   DUPIXENT 300 MG/2ML SOPN Inject into the skin.    ferrous sulfate 325 (65 FE) MG tablet Take 1 tablet (325 mg total) by mouth 2 (two) times daily with a meal. 03/11/2023: One a day   cholecalciferol (VITAMIN D) 1000 UNITS tablet Take 2 tablets (2,000 Units total) by mouth daily. (Patient not taking: Reported on 06/01/2019)    ibuprofen (ADVIL) 800 MG tablet Take 1 tablet (800 mg total) by mouth every 8 (eight) hours as needed. (Patient not taking: Reported on 03/11/2023)    [DISCONTINUED] fluocinonide-emollient (LIDEX-E) 0.05 % cream Apply 1 application topically 2 (two) times daily. (Patient not taking: Reported on 03/11/2023)    [DISCONTINUED] predniSONE (STERAPRED UNI-PAK 21 TAB) 10 MG  (21) TBPK tablet Take by mouth daily. Per box instruction (Patient not taking: Reported on 03/11/2023)    No facility-administered medications prior to visit.    Review of Systems  Constitutional:  Negative for activity change, appetite change, chills, diaphoresis, fatigue, fever and unexpected weight change.  HENT:  Negative for congestion, dental problem, drooling, ear discharge, ear pain, facial swelling, hearing loss, mouth sores, nosebleeds, postnasal drip, rhinorrhea, sinus pressure, sinus pain, sneezing, sore throat and tinnitus.   Eyes:  Negative for pain, discharge, redness and itching.  Respiratory:  Negative for cough, choking, chest tightness, shortness of breath and wheezing.   Cardiovascular:  Negative for chest pain, palpitations and leg swelling.  Gastrointestinal:  Negative for abdominal distention, abdominal pain, anal bleeding, blood in stool, constipation, diarrhea, nausea, rectal pain and vomiting.  Endocrine: Negative for cold intolerance, heat intolerance, polydipsia, polyphagia and polyuria.  Genitourinary:  Negative for difficulty urinating, dysuria, flank pain, frequency, genital sores and hematuria.  Musculoskeletal:  Negative for arthralgias, back pain, gait problem, joint swelling and myalgias.  Skin:  Negative for color change, pallor, rash and wound.  Allergic/Immunologic: Negative for environmental allergies, food allergies and immunocompromised state.  Neurological:  Negative for dizziness, tremors, seizures, syncope, facial asymmetry, speech difficulty, weakness, light-headedness, numbness and headaches.  Hematological:  Negative for adenopathy.  Does not bruise/bleed easily.  Psychiatric/Behavioral:  Negative for agitation, behavioral problems, confusion, hallucinations, self-injury, sleep disturbance and suicidal ideas. The patient is not nervous/anxious.        Objective:     BP 107/65   Pulse 83   Temp 98 F (36.7 C)   Ht 5\' 2"  (1.575 m)   Wt 93 lb  9.6 oz (42.5 kg)   LMP 02/24/2023 (Exact Date)   SpO2 100%   BMI 17.12 kg/m    Physical Exam Vitals and nursing note reviewed. Exam conducted with a chaperone present.  Constitutional:      General: She is not in acute distress.    Appearance: Normal appearance. She is not ill-appearing, toxic-appearing or diaphoretic.     Comments: Thin appearing  HENT:     Right Ear: Tympanic membrane, ear canal and external ear normal. There is no impacted cerumen.     Left Ear: Tympanic membrane, ear canal and external ear normal. There is no impacted cerumen.     Nose: Nose normal. No congestion or rhinorrhea.     Mouth/Throat:     Mouth: Mucous membranes are moist.     Pharynx: Oropharynx is clear. No oropharyngeal exudate or posterior oropharyngeal erythema.  Eyes:     General: No scleral icterus.       Right eye: No discharge.        Left eye: No discharge.     Extraocular Movements: Extraocular movements intact.     Conjunctiva/sclera: Conjunctivae normal.  Neck:     Vascular: No carotid bruit.  Cardiovascular:     Rate and Rhythm: Normal rate and regular rhythm.     Pulses: Normal pulses.     Heart sounds: Normal heart sounds. No murmur heard.    No friction rub. No gallop.  Pulmonary:     Effort: Pulmonary effort is normal. No respiratory distress.     Breath sounds: Normal breath sounds. No stridor. No wheezing, rhonchi or rales.  Chest:     Chest wall: No tenderness.  Abdominal:     General: Bowel sounds are normal. There is no distension.     Palpations: Abdomen is soft. There is no mass.     Tenderness: There is no abdominal tenderness. There is no right CVA tenderness, left CVA tenderness, guarding or rebound.     Hernia: No hernia is present.  Musculoskeletal:        General: No swelling, tenderness, deformity or signs of injury.     Cervical back: Normal range of motion and neck supple. No rigidity or tenderness.     Right lower leg: No edema.     Left lower leg: No  edema.  Lymphadenopathy:     Cervical: No cervical adenopathy.  Skin:    General: Skin is warm and dry.     Capillary Refill: Capillary refill takes less than 2 seconds.     Coloration: Skin is not jaundiced or pale.     Findings: No bruising, erythema, lesion or rash.  Neurological:     Mental Status: She is alert and oriented to person, place, and time.     Cranial Nerves: No cranial nerve deficit.     Sensory: No sensory deficit.     Motor: No weakness.     Coordination: Coordination normal.     Gait: Gait normal.     Deep Tendon Reflexes: Reflexes normal.  Psychiatric:        Mood and Affect: Mood normal.  Behavior: Behavior normal.        Thought Content: Thought content normal.        Judgment: Judgment normal.     No results found for any visits on 03/11/23.     Assessment & Plan:    Routine Health Maintenance and Physical Exam  Immunization History  Administered Date(s) Administered   DTaP 12/10/1996, 03/21/1997, 05/27/1997, 01/11/1998, 08/26/2002   HIB (PRP-OMP) 12/10/1996, 03/21/1997, 05/27/1997, 01/11/1998   HPV Quadrivalent 06/05/2009, 05/15/2012, 11/12/2013   Hepatitis A 05/16/2006, 06/05/2009   Hepatitis B 12/10/1996, 05/27/1997, 01/11/1998   IPV 12/10/1996, 03/21/1997, 11/14/1997, 08/26/2002   Influenza Nasal 08/20/2012, 11/12/2013   Influenza Split 09/05/2011   Influenza,Quad,Nasal, Live 11/12/2013   Influenza,inj,Quad PF,6+ Mos 09/01/2015   Influenza-Unspecified 10/27/2003   MMR 11/14/1997, 08/26/2002   Meningococcal Conjugate 06/05/2009   Tdap 06/05/2009, 05/03/2019   Varicella 11/14/1997, 05/16/2006    Health Maintenance  Topic Date Due   COVID-19 Vaccine (1) Never done   Hepatitis C Screening  Never done   PAP-Cervical Cytology Screening  Never done   PAP SMEAR-Modifier  Never done   INFLUENZA VACCINE  06/05/2023   DTaP/Tdap/Td (8 - Td or Tdap) 05/02/2029   HPV VACCINES  Completed   HIV Screening  Completed    Discussed health  benefits of physical activity, and encouraged her to engage in regular exercise appropriate for her age and condition.  Problem List Items Addressed This Visit       Musculoskeletal and Integument   Intrinsic atopic dermatitis    She is followed by dermatology, on [place] presents 300 mg injection every 2 weeks Patient encouraged to maintain close follow-up with dermatologist        Other   Iron deficiency anemia    Taking ferrous sulfate 325 mg once daily Checking CBC and iron panel today      Relevant Orders   Iron, TIBC and Ferritin Panel   Annual physical exam - Primary    Annual exam as documented.  Counseling done include healthy lifestyle involving committing to 150 minutes of exercise per week, heart healthy diet, and attaining healthy weight. The importance of adequate sleep also discussed.  Regular use of seat belt and home safety were also discussed .Marland Kitchen Immunization and cancer screening  needs are specifically addressed at this visit.    Routine fasting labs ordered      Other Visit Diagnoses     Screening for endocrine, nutritional, metabolic and immunity disorder       Relevant Orders   CBC with Differential/Platelet   Hemoglobin A1c   CMP14+EGFR   Hepatitis C antibody   Lipid panel   TSH   VITAMIN D 25 Hydroxy (Vit-D Deficiency, Fractures)   Impaired vision       Relevant Orders   Ambulatory referral to Ophthalmology      Return in about 1 year (around 03/10/2024) for CPE, FASTING LABS THIS WEEK.     Donell Beers, FNP

## 2023-03-11 NOTE — Patient Instructions (Signed)

## 2023-03-11 NOTE — Assessment & Plan Note (Signed)
Taking ferrous sulfate 325 mg once daily Checking CBC and iron panel today

## 2023-03-11 NOTE — Assessment & Plan Note (Signed)
Annual exam as documented.  Counseling done include healthy lifestyle involving committing to 150 minutes of exercise per week, heart healthy diet, and attaining healthy weight. The importance of adequate sleep also discussed.  Regular use of seat belt and home safety were also discussed .  Immunization and cancer screening  needs are specifically addressed at this visit.   Routine fasting labs ordered 

## 2023-03-12 ENCOUNTER — Other Ambulatory Visit: Payer: 59

## 2023-03-12 DIAGNOSIS — Z13228 Encounter for screening for other metabolic disorders: Secondary | ICD-10-CM | POA: Diagnosis not present

## 2023-03-12 DIAGNOSIS — D509 Iron deficiency anemia, unspecified: Secondary | ICD-10-CM

## 2023-03-12 DIAGNOSIS — Z130 Encounter for screening for diseases of the blood and blood-forming organs and certain disorders involving the immune mechanism: Secondary | ICD-10-CM | POA: Diagnosis not present

## 2023-03-12 DIAGNOSIS — Z1329 Encounter for screening for other suspected endocrine disorder: Secondary | ICD-10-CM | POA: Diagnosis not present

## 2023-03-12 DIAGNOSIS — Z1321 Encounter for screening for nutritional disorder: Secondary | ICD-10-CM | POA: Diagnosis not present

## 2023-03-13 LAB — CBC WITH DIFFERENTIAL/PLATELET
Basophils Absolute: 0 10*3/uL (ref 0.0–0.2)
Basos: 1 %
EOS (ABSOLUTE): 0.1 10*3/uL (ref 0.0–0.4)
Eos: 2 %
Hematocrit: 34.5 % (ref 34.0–46.6)
Hemoglobin: 10.4 g/dL — ABNORMAL LOW (ref 11.1–15.9)
Immature Grans (Abs): 0 10*3/uL (ref 0.0–0.1)
Immature Granulocytes: 0 %
Lymphocytes Absolute: 2 10*3/uL (ref 0.7–3.1)
Lymphs: 32 %
MCH: 23 pg — ABNORMAL LOW (ref 26.6–33.0)
MCHC: 30.1 g/dL — ABNORMAL LOW (ref 31.5–35.7)
MCV: 76 fL — ABNORMAL LOW (ref 79–97)
Monocytes Absolute: 0.5 10*3/uL (ref 0.1–0.9)
Monocytes: 8 %
Neutrophils Absolute: 3.8 10*3/uL (ref 1.4–7.0)
Neutrophils: 57 %
Platelets: 212 10*3/uL (ref 150–450)
RBC: 4.53 x10E6/uL (ref 3.77–5.28)
RDW: 13.6 % (ref 11.7–15.4)
WBC: 6.5 10*3/uL (ref 3.4–10.8)

## 2023-03-13 LAB — CMP14+EGFR
ALT: 9 IU/L (ref 0–32)
AST: 17 IU/L (ref 0–40)
Albumin/Globulin Ratio: 1.8 (ref 1.2–2.2)
Albumin: 4.4 g/dL (ref 4.0–5.0)
Alkaline Phosphatase: 53 IU/L (ref 44–121)
BUN/Creatinine Ratio: 16 (ref 9–23)
BUN: 10 mg/dL (ref 6–20)
Bilirubin Total: 0.4 mg/dL (ref 0.0–1.2)
CO2: 17 mmol/L — ABNORMAL LOW (ref 20–29)
Calcium: 8.8 mg/dL (ref 8.7–10.2)
Chloride: 107 mmol/L — ABNORMAL HIGH (ref 96–106)
Creatinine, Ser: 0.61 mg/dL (ref 0.57–1.00)
Globulin, Total: 2.4 g/dL (ref 1.5–4.5)
Glucose: 81 mg/dL (ref 70–99)
Potassium: 4.1 mmol/L (ref 3.5–5.2)
Sodium: 139 mmol/L (ref 134–144)
Total Protein: 6.8 g/dL (ref 6.0–8.5)
eGFR: 126 mL/min/{1.73_m2} (ref >59)

## 2023-03-13 LAB — HEPATITIS C ANTIBODY: Hep C Virus Ab: NONREACTIVE

## 2023-03-13 LAB — TSH: TSH: 1.23 u[IU]/mL (ref 0.450–4.500)

## 2023-03-13 LAB — IRON,TIBC AND FERRITIN PANEL
Ferritin: 8 ng/mL — ABNORMAL LOW (ref 15–150)
Iron Saturation: 14 % — ABNORMAL LOW (ref 15–55)
Iron: 51 ug/dL (ref 27–159)
Total Iron Binding Capacity: 357 ug/dL (ref 250–450)
UIBC: 306 ug/dL (ref 131–425)

## 2023-03-13 LAB — LIPID PANEL
Chol/HDL Ratio: 1.6 ratio (ref 0.0–4.4)
Cholesterol, Total: 111 mg/dL (ref 100–199)
HDL: 68 mg/dL (ref >39)
LDL Chol Calc (NIH): 34 mg/dL (ref 0–99)
Triglycerides: 30 mg/dL (ref 0–149)
VLDL Cholesterol Cal: 9 mg/dL (ref 5–40)

## 2023-03-13 LAB — HEMOGLOBIN A1C
Est. average glucose Bld gHb Est-mCnc: 114 mg/dL
Hgb A1c MFr Bld: 5.6 % (ref 4.8–5.6)

## 2023-03-13 LAB — VITAMIN D 25 HYDROXY (VIT D DEFICIENCY, FRACTURES): Vit D, 25-Hydroxy: 15 ng/mL — ABNORMAL LOW (ref 30.0–100.0)

## 2023-03-14 ENCOUNTER — Other Ambulatory Visit: Payer: Self-pay | Admitting: Nurse Practitioner

## 2023-03-14 DIAGNOSIS — E559 Vitamin D deficiency, unspecified: Secondary | ICD-10-CM

## 2023-03-14 MED ORDER — VITAMIN D (ERGOCALCIFEROL) 1.25 MG (50000 UNIT) PO CAPS: 50000.0000 [IU] | ORAL_CAPSULE | ORAL | 0 refills | Status: AC

## 2023-05-23 ENCOUNTER — Ambulatory Visit (INDEPENDENT_AMBULATORY_CARE_PROVIDER_SITE_OTHER): Payer: 59 | Admitting: Nurse Practitioner

## 2023-05-23 ENCOUNTER — Encounter: Payer: Self-pay | Admitting: Nurse Practitioner

## 2023-05-23 VITALS — BP 99/60 | HR 80 | Temp 97.5°F | Ht 62.0 in | Wt 95.0 lb

## 2023-05-23 DIAGNOSIS — R112 Nausea with vomiting, unspecified: Secondary | ICD-10-CM

## 2023-05-23 DIAGNOSIS — D509 Iron deficiency anemia, unspecified: Secondary | ICD-10-CM

## 2023-05-23 DIAGNOSIS — N923 Ovulation bleeding: Secondary | ICD-10-CM

## 2023-05-23 NOTE — Assessment & Plan Note (Signed)
Had one occurrence last month. Had normal regular menses recently Will monitor and refer to OBGYN if it reoccurs.

## 2023-05-23 NOTE — Progress Notes (Signed)
Acute Office Visit  Subjective:     Patient ID: Kimberly Odom, female    DOB: [birth year], 27 y.o.   MRN: [id number]  Chief Complaint  Patient presents with   Menstrual Problem    HPI Ms. Chrisman  has a past medical history of Allergy, Eczema, and Intrinsic atopic dermatitis. Patient is in today for c/o  spotting in between her menses . Has regular menses lasting 5 days each month. . Had spotting 2 weeks after her menstrual cycle in June, also had nausea, vomiting and diarrhea which has resolved. Her recent cycle which ended 2 days ago was normal without any issues. ,   Review of Systems  Constitutional:  Negative for activity change, appetite change, chills, fatigue and fever.  HENT:  Negative for congestion, dental problem, ear discharge, ear pain, hearing loss, rhinorrhea, sinus pressure, sinus pain, sneezing and sore throat.   Eyes:  Negative for pain, discharge, redness and itching.  Respiratory:  Negative for cough, chest tightness, shortness of breath and wheezing.   Cardiovascular:  Negative for chest pain, palpitations and leg swelling.  Gastrointestinal:  Negative for abdominal distention, abdominal pain, anal bleeding, blood in stool, constipation, diarrhea, nausea, rectal pain and vomiting.  Endocrine: Negative for cold intolerance, heat intolerance, polydipsia, polyphagia and polyuria.  Genitourinary:  Positive for menstrual problem. Negative for difficulty urinating, dysuria, flank pain, frequency, hematuria, pelvic pain and vaginal bleeding.  Musculoskeletal:  Negative for arthralgias, back pain, gait problem, joint swelling and myalgias.  Skin:  Negative for color change, pallor, rash and wound.  Allergic/Immunologic: Negative for environmental allergies, food allergies and immunocompromised state.  Neurological:  Negative for dizziness, tremors, facial asymmetry, weakness and headaches.  Hematological:  Negative for adenopathy. Does not bruise/bleed easily.   Psychiatric/Behavioral:  Negative for agitation, behavioral problems, confusion, decreased concentration, hallucinations, self-injury and suicidal ideas.         Objective:    BP 99/60   Pulse 80   Temp (!) 97.5 F (36.4 C)   Ht 5\' 2"  (1.575 m)   Wt 95 lb (43.1 kg)   LMP 05/21/2023 (Exact Date)   SpO2 100%   BMI 17.38 kg/m    Physical Exam Vitals and nursing note reviewed.  Constitutional:      General: She is not in acute distress.    Appearance: Normal appearance. She is not ill-appearing, toxic-appearing or diaphoretic.  HENT:     Mouth/Throat:     Mouth: Mucous membranes are moist.     Pharynx: Oropharynx is clear. No oropharyngeal exudate or posterior oropharyngeal erythema.  Eyes:     General: No scleral icterus.       Right eye: No discharge.        Left eye: No discharge.     Extraocular Movements: Extraocular movements intact.     Conjunctiva/sclera: Conjunctivae normal.  Cardiovascular:     Rate and Rhythm: Normal rate and regular rhythm.     Pulses: Normal pulses.     Heart sounds: Normal heart sounds. No murmur heard.    No friction rub. No gallop.  Pulmonary:     Effort: Pulmonary effort is normal. No respiratory distress.     Breath sounds: Normal breath sounds. No stridor. No wheezing, rhonchi or rales.  Chest:     Chest wall: No tenderness.  Abdominal:     General: There is no distension.     Palpations: Abdomen is soft.     Tenderness: There is no abdominal tenderness. There is no  right CVA tenderness, left CVA tenderness or guarding.  Musculoskeletal:        General: No swelling, tenderness, deformity or signs of injury.     Right lower leg: No edema.     Left lower leg: No edema.  Skin:    General: Skin is warm and dry.     Capillary Refill: Capillary refill takes less than 2 seconds.     Coloration: Skin is not jaundiced or pale.     Findings: No bruising, erythema or lesion.  Neurological:     Mental Status: She is alert and oriented to  person, place, and time.     Motor: No weakness.     Coordination: Coordination normal.     Gait: Gait normal.  Psychiatric:        Mood and Affect: Mood normal.        Behavior: Behavior normal.        Thought Content: Thought content normal.        Judgment: Judgment normal.     No results found for any visits on 05/23/23.      Assessment & Plan:   Problem List Items Addressed This Visit       Digestive   Nausea and vomiting     Could be due to gastroenteritis,or related to her menses,  symptoms are  now resolved.          Other   Iron deficiency anemia    Lab Results  Component Value Date   WBC 6.5 03/12/2023   HGB 10.4 (L) 03/12/2023   HCT 34.5 03/12/2023   MCV 76 (L) 03/12/2023   PLT 212 03/12/2023   Taking  ferrous sulfate 325mg  BID Will recheck labs at next visit.       Spotting between menses - Primary    Had one occurrence last month. Had normal regular menses recently Will monitor and refer to OBGYN if it reoccurs.        No orders of the defined types were placed in this encounter.   Return in about 2 months (around 07/24/2023) for VITAMIN D , anemia.  Donell Beers, FNP

## 2023-05-23 NOTE — Assessment & Plan Note (Signed)
Lab Results  Component Value Date   WBC 6.5 03/12/2023   HGB 10.4 (L) 03/12/2023   HCT 34.5 03/12/2023   MCV 76 (L) 03/12/2023   PLT 212 03/12/2023   Taking  ferrous sulfate 325mg  BID Will recheck labs at next visit.

## 2023-05-23 NOTE — Assessment & Plan Note (Addendum)
  Could be due to gastroenteritis,or related to her menses,  symptoms are  now resolved.

## 2023-05-23 NOTE — Patient Instructions (Signed)

## 2023-05-27 DIAGNOSIS — Z113 Encounter for screening for infections with a predominantly sexual mode of transmission: Secondary | ICD-10-CM | POA: Diagnosis not present

## 2023-05-27 DIAGNOSIS — Z01419 Encounter for gynecological examination (general) (routine) without abnormal findings: Secondary | ICD-10-CM | POA: Diagnosis not present

## 2023-05-27 DIAGNOSIS — B3731 Acute candidiasis of vulva and vagina: Secondary | ICD-10-CM | POA: Diagnosis not present

## 2023-05-27 DIAGNOSIS — A599 Trichomoniasis, unspecified: Secondary | ICD-10-CM | POA: Diagnosis not present

## 2023-05-27 DIAGNOSIS — A5901 Trichomonal vulvovaginitis: Secondary | ICD-10-CM | POA: Diagnosis not present

## 2023-07-25 ENCOUNTER — Ambulatory Visit: Payer: Self-pay | Admitting: Nurse Practitioner

## 2023-08-21 ENCOUNTER — Inpatient Hospital Stay: Admission: RE | Admit: 2023-08-21 | Payer: Medicaid Other | Source: Ambulatory Visit

## 2023-08-21 ENCOUNTER — Other Ambulatory Visit: Payer: Self-pay | Admitting: Nurse Practitioner

## 2023-08-21 DIAGNOSIS — N6310 Unspecified lump in the right breast, unspecified quadrant: Secondary | ICD-10-CM

## 2023-11-05 NOTE — L&D Delivery Note (Signed)
 Delivery Note Labor onset:  10/21/24 Labor Onset Time: 0200 Complete dilation at  unknown Onset of pushing at N/A, mother did not push FHR second stage Cat 1 and 2 Analgesia/Anesthesia intrapartum: Epidural  Called to bedside by RN reporting that an unattended birth had occurred without the pt knowing. RN went into the room to adjust the fetal monitor and discovered the baby in the bed. Pt had not felt pressure, decent, or birth of baby. Viable female at 1509.    CNM arrived to find the newborn on mother's chest with the placenta in situ with no blood loss.  Infant placed on maternal abd, dried, and tactile stim.  Cord double clamped after 8 min and cut by pt's mother, Viesta.   Cord blood sample collected: Yes Arterial cord blood sample collected: No  Placenta delivered [facility] side @ 1525, trailing membranes and a marginal cord insertion, intact, with 3 VC.  Placenta to pathology. Uterine tone firm, bleeding moderate w/ one large clot  No laceration identified.  QBL (mL): 460 Complications: none APGAR: APGAR (1 MIN):  10 APGAR (5 MINS):  9 APGAR (10 MINS):   Mom to postpartum.  Baby to Couplet care / Skin to Skin. Circumcision Yes  [name] DNP, CNM 10/21/2024, 3:38 PM

## 2024-01-03 ENCOUNTER — Other Ambulatory Visit: Payer: Self-pay

## 2024-01-03 ENCOUNTER — Encounter (HOSPITAL_BASED_OUTPATIENT_CLINIC_OR_DEPARTMENT_OTHER): Payer: Self-pay

## 2024-01-03 ENCOUNTER — Emergency Department (HOSPITAL_BASED_OUTPATIENT_CLINIC_OR_DEPARTMENT_OTHER)
Admission: EM | Admit: 2024-01-03 | Discharge: 2024-01-03 | Disposition: A | Discharge status: Home / Self Care | Attending: Emergency Medicine | Admitting: Emergency Medicine

## 2024-01-03 DIAGNOSIS — R55 Syncope and collapse: Secondary | ICD-10-CM | POA: Insufficient documentation

## 2024-01-03 NOTE — ED Triage Notes (Addendum)
 She reports suddenly feeling "like I couldn't breath" while at work today. EMS tells [place] pt's. V.s are WDL for her age. She arrives here in no distress. Upon arrival she is speaking with her sister on her cell phone, which seems to have a calming effect. She specifically denies s.I./h.I.

## 2024-01-03 NOTE — ED Provider Notes (Signed)
 [place] EMERGENC[facility] DEPARTMENT AT [facility] Provider Note   CSN: [id number] Arrival date & time: 01/03/24  1718     History  Chief Complaint  Patient presents with   Anxiety    Kimberly Odom is a 28 y.o. female with PMHx eczema who presents to ED concerned for an episode of pre-syncope while at work today. Patient was working at [employer]. She was standing when her vision started going dark. She sat down and then felt like she could not breathe which resolved within a few minutes. Patient stating that she felt some mild pelvic cramping earlier today which usually happens before her period - but she had no other prodromal symptoms before her pre-syncopal episode today.  Denies fever, chest pain, cough, nausea, vomiting, diarrhea, dysuria, hematuria, hematochezia.    Anxiety       Home Medications Prior to Admission medications   Medication Sig Start Date End Date Taking? Authorizing Provider  DUPIXENT 300 MG/2ML SOPN Inject into the skin.    [provider]  ferrous sulfate 325 (65 FE) MG tablet Take 1 tablet (325 mg total) by mouth 2 (two) times daily with a meal. 05/04/19   Janeece Riggers, CNM  ibuprofen (ADVIL) 800 MG tablet Take 1 tablet (800 mg total) by mouth every 8 (eight) hours as needed. Patient not taking: Reported on 03/11/2023 06/01/19   Charlestine Night, PA-C  Vitamin D, Ergocalciferol, (DRISDOL) 1.25 MG (50000 UNIT) CAPS capsule Take 1 capsule (50,000 Units total) by mouth every 7 (seven) days. Patient not taking: Reported on 05/23/2023 03/14/23   Donell Beers, FNP      Allergies    Shellfish allergy, Cinnamon, and Peanuts [peanut oil]    Review of Systems   Review of Systems  Neurological:        Pre-syncope    Physical Exam Updated Vital Signs BP 103/71 (BP Location: Right Arm)   Pulse 89   Temp 98 F (36.7 C)   Resp 20   LMP 11/18/2023 (Approximate)   SpO2 100%  Physical Exam Vitals and nursing note reviewed.   Constitutional:      General: She is not in acute distress.    Appearance: She is not ill-appearing or toxic-appearing.  HENT:     Head: Normocephalic and atraumatic.     Mouth/Throat:     Mouth: Mucous membranes are moist.  Eyes:     General: No scleral icterus.       Right eye: No discharge.        Left eye: No discharge.     Conjunctiva/sclera: Conjunctivae normal.  Cardiovascular:     Rate and Rhythm: Normal rate and regular rhythm.     Pulses: Normal pulses.     Heart sounds: Normal heart sounds. No murmur heard. Pulmonary:     Effort: Pulmonary effort is normal. No respiratory distress.     Breath sounds: Normal breath sounds. No wheezing, rhonchi or rales.  Abdominal:     General: Abdomen is flat. Bowel sounds are normal. There is no distension.     Palpations: Abdomen is soft. There is no mass.     Tenderness: There is no abdominal tenderness.  Musculoskeletal:     Right lower leg: No edema.     Left lower leg: No edema.  Skin:    General: Skin is warm and dry.     Findings: No rash.  Neurological:     General: No focal deficit present.     Mental  Status: She is alert and oriented to person, place, and time. Mental status is at baseline.  Psychiatric:        Mood and Affect: Mood normal.        Behavior: Behavior normal.     ED Results / Procedures / Treatments   Labs (all labs ordered are listed, but only abnormal results are displayed) Labs Reviewed - No data to display  EKG None  Radiology No results found.  Procedures Procedures    Medications Ordered in ED Medications - No data to display  ED Course/ Medical Decision Making/ A&P                                 Medical Decision Making  This patient presents to the ED for concern of pre-syncope, this involves an extensive number of treatment options, and is a complaint that carries with it a high risk of complications and morbidity.  The differential diagnosis includes CVA, ICH, intracranial  mass, critical dehydration, endocrine abnormality, sepsis/infection, electrolyte abnormality, cardiac arrhythmia.   Co morbidities that complicate the patient evaluation  none   Additional history obtained:  Dr. Geoffery Spruce PCP   Problem List / ED Course / Critical interventions / Medication management  Patient presents to ED concern for presyncopal episode while standing at work today.  During presyncopal episode, patient stated that it felt like she could not breathe.  Denies any prodromal symptoms.  Does endorse some mild pelvic cramping earlier today.  No recent infectious symptoms. Patient stating that she does not want any work done today.  Offered patient blood work stating that we may need to look at her electrolytes and kidney function test.  Patient declined stating that she does not want blood work.  I offered patient less invasive medical workup such as orthostatic vital signs - patient declined this as well.  Patient stating that she feels fine and does not think she needs any kind of workup today. I once again gave patient an opportunity for some sort of medical workup - but she declined again. Educated patient that she can change her mind at any time.  I also recommended following up with PCP.  Patient verbalized understanding of plan. Answered all of patient's questions. I have reviewed the patients home medicines and have made adjustments as needed Patient afebrile with stable vitals.  Provided with return precautions.  Discharged in good condition.   Social Determinants of Health:  none          Final Clinical Impression(s) / ED Diagnoses Final diagnoses:  Pre-syncope    Rx / DC Orders ED Discharge Orders     None         Margarita Rana 01/03/24 2043    Rondel Baton, MD 01/05/24 1140

## 2024-01-03 NOTE — Discharge Instructions (Addendum)
 It was a pleasure caring for you today.  Please follow-up with your primary care provider next 48 to 72 hours.  Seek emergency care if experiencing any new or worsening symptoms.

## 2024-01-05 ENCOUNTER — Telehealth: Payer: Self-pay

## 2024-01-05 NOTE — Transitions of Care (Post Inpatient/ED Visit) (Signed)
   01/05/2024  Name: Kimberly Odom MRN: [id number] DOB: 1996-03-09  Today's TOC FU Call Status: Today's TOC FU Call Status:: Unsuccessful Call (1st Attempt) Unsuccessful Call (1st Attempt) Date: 01/05/24  Attempted to reach the patient regarding the most recent Inpatient/ED visit.  Follow Up Plan: Additional outreach attempts will be made to reach the patient to complete the Transitions of Care (Post Inpatient/ED visit) call.   Signature  [employer], [place]

## 2024-01-06 ENCOUNTER — Telehealth: Payer: Self-pay

## 2024-01-06 NOTE — Transitions of Care (Post Inpatient/ED Visit) (Signed)
   01/06/2024  Name: Kimberly Odom MRN: [id number] DOB: June 28, 1996  Today's TOC FU Call Status: Today's TOC FU Call Status:: Unsuccessful Call (2nd Attempt) Unsuccessful Call (2nd Attempt) Date: 01/06/24  Attempted to reach the patient regarding the most recent Inpatient/ED visit.  Follow Up Plan: Additional outreach attempts will be made to reach the patient to complete the Transitions of Care (Post Inpatient/ED visit) call.   Signature  [employer], [place]

## 2024-02-02 ENCOUNTER — Emergency Department (HOSPITAL_BASED_OUTPATIENT_CLINIC_OR_DEPARTMENT_OTHER)
Admission: EM | Admit: 2024-02-02 | Discharge: 2024-02-02 | Disposition: A | Discharge status: Home / Self Care | Attending: Emergency Medicine | Admitting: Emergency Medicine

## 2024-02-02 ENCOUNTER — Encounter (HOSPITAL_BASED_OUTPATIENT_CLINIC_OR_DEPARTMENT_OTHER): Payer: Self-pay

## 2024-02-02 ENCOUNTER — Other Ambulatory Visit: Payer: Self-pay

## 2024-02-02 DIAGNOSIS — B3731 Acute candidiasis of vulva and vagina: Secondary | ICD-10-CM | POA: Diagnosis not present

## 2024-02-02 DIAGNOSIS — A599 Trichomoniasis, unspecified: Secondary | ICD-10-CM

## 2024-02-02 DIAGNOSIS — A5901 Trichomonal vulvovaginitis: Secondary | ICD-10-CM | POA: Insufficient documentation

## 2024-02-02 DIAGNOSIS — N898 Other specified noninflammatory disorders of vagina: Secondary | ICD-10-CM | POA: Diagnosis present

## 2024-02-02 LAB — WET PREP, GENITAL
Clue Cells Wet Prep HPF POC: NONE SEEN
Sperm: NONE SEEN
WBC, Wet Prep HPF POC: >=10 — AB (ref <10)

## 2024-02-02 LAB — HIV ANTIBODY (ROUTINE TESTING W REFLEX): HIV Screen 4th Generation wRfx: NONREACTIVE

## 2024-02-02 MED ORDER — METRONIDAZOLE 500 MG PO TABS
2000.0000 mg | ORAL_TABLET | Freq: Once | ORAL | Status: AC
  Administered 2024-02-02: 2000 mg via ORAL
  Filled 2024-02-02: qty 4

## 2024-02-02 MED ORDER — FLUCONAZOLE 200 MG PO TABS: 200.0000 mg | ORAL_TABLET | Freq: Every day | ORAL | 0 refills | Status: AC

## 2024-02-02 NOTE — ED Triage Notes (Addendum)
 Pt states she has been having a yellowish/green vaginal discharge x 3 weeks  Denies any pain Requesting STD testing

## 2024-02-02 NOTE — Discharge Instructions (Signed)
 You have been seen today in the Emergency Department (ED) for pelvic pain and discharge.  Your workup today shows that you had one or more sexually transmitted infections (STIs).  You have been treated with a one time dose medication for both of these conditions.  Please have your partner tested for STD's and do not resume sexual activity until you and your partner have confirmed negative test results or have both been treated.  Please follow up with your doctor as soon as possible regarding today's ED visit and your symptoms.   Return to the ED if your pain worsens, you develop a fever, or for any other symptoms that concern you.

## 2024-02-02 NOTE — ED Provider Notes (Signed)
 Emergency Department Provider Note   I have reviewed the triage vital signs and the nursing notes.   HISTORY  Chief Complaint Vaginal Discharge   HPI Kimberly Odom is a 28 y.o. female with past history reviewed below presents to the emergency department with painless vaginal discharge over the past 3 weeks.  Notes some recent unprotected sex but no known exposure to STI.  Denies any dysuria, hesitancy, urgency.  She has discharge without bleeding.  No severe back pain.   Past Medical History:  Diagnosis Date   Allergy    Eczema    Intrinsic atopic dermatitis     Review of Systems  Constitutional: No fever/chills Cardiovascular: Denies chest pain. Respiratory: Denies shortness of breath. Gastrointestinal: No abdominal pain.  No nausea, no vomiting. Positive vaginal discharge. Skin: Negative for rash. Neurological: Negative for headaches, focal weakness or numbness.  ____________________________________________   PHYSICAL EXAM:  VITAL SIGNS: ED Triage Vitals  Encounter Vitals Group     BP 02/02/24 0624 107/76     Pulse Rate 02/02/24 0624 77     Resp 02/02/24 0624 18     Temp 02/02/24 0624 98 F (36.7 C)     Temp Source 02/02/24 0624 Temporal     SpO2 02/02/24 0624 100 %     Weight 02/02/24 0625 97 lb (44 kg)     Height 02/02/24 0625 5\' 2"  (1.575 m)   Constitutional: Alert and oriented. Well appearing and in no acute distress. Eyes: Conjunctivae are normal.  Head: Atraumatic. Nose: No congestion/rhinnorhea. Mouth/Throat: Mucous membranes are moist.  Neck: No stridor.  Cardiovascular:  Good peripheral circulation. Respiratory: Normal respiratory effort.   Gastrointestinal: No distention.  Musculoskeletal:  No gross deformities of extremities. Neurologic:  Normal speech and language. Skin:  Skin is warm, dry and intact. No rash noted.  ____________________________________________   LABS (all labs ordered are listed, but only abnormal results are  displayed)  Labs Reviewed  WET PREP, GENITAL - Abnormal; Notable for the following components:      Result Value   Yeast Wet Prep HPF POC PRESENT (*)    Trich, Wet Prep PRESENT (*)    WBC, Wet Prep HPF POC >=10 (*)    All other components within normal limits  HIV ANTIBODY (ROUTINE TESTING W REFLEX)  RPR  GC/CHLAMYDIA PROBE AMP () NOT AT [facility]    ____________________________________________   PROCEDURES  Procedure(s) performed:   Procedures  None  ____________________________________________   INITIAL IMPRESSION / ASSESSMENT AND PLAN / ED COURSE  Pertinent labs & imaging results that were available during my care of the patient were reviewed by me and considered in my medical decision making (see chart for details).   This patient is Presenting for Evaluation of vaginal discharge, which does require a range of treatment options, and is a complaint that involves a moderate risk of morbidity and mortality.  The Differential Diagnoses include STI, BV, yeast infection, PID, etc.   Clinical Laboratory Tests Ordered, included   Medical Decision Making: Summary:  Patient presents emergency department with vaginal discharge without pain.  Reassuring exam.  Normal vitals.  Gave option for self swab and blood testing for HIV/RPR.  Patient provided consent for this.  Discussed condom use and abstinence pending results, which she will follow in MyChart.   Reevaluation with update and discussion with patient.  I discussed diagnosis with plan to treat here.  Discussed she will need to alert any partner(s) regarding diagnosis so they can also seek  treatment to avoid spreading this to others or reinfecting the patient.  She will follow the additional test results in the MyChart app.  Discussed disulfiram like reaction of Flagyl with alcohol.  Patient's presentation is most consistent with acute, uncomplicated illness.   Disposition:  discharge  ____________________________________________  FINAL CLINICAL IMPRESSION(S) / ED DIAGNOSES  Final diagnoses:  Trichomonas infection  Vaginal candidiasis     NEW OUTPATIENT MEDICATIONS STARTED DURING THIS VISIT:  New Prescriptions   FLUCONAZOLE (DIFLUCAN) 200 MG TABLET    Take 1 tablet (200 mg total) by mouth daily for 2 days.    Note:  This document was prepared using Dragon voice recognition software and may include unintentional dictation errors.  Alona Bene, MD, [facility] Emergency Medicine    Aparna Vanderweele, Arlyss Repress, MD 02/02/24 [phone]

## 2024-02-03 ENCOUNTER — Telehealth: Payer: Self-pay

## 2024-02-03 LAB — RPR: RPR Ser Ql: NONREACTIVE

## 2024-02-03 LAB — GC/CHLAMYDIA PROBE AMP (~~LOC~~) NOT AT ARMC
Chlamydia: NEGATIVE
Comment: NEGATIVE
Comment: NORMAL
Neisseria Gonorrhea: NEGATIVE

## 2024-02-03 NOTE — Transitions of Care (Post Inpatient/ED Visit) (Signed)
   02/03/2024  Name: Kimberly Odom MRN: [id number] DOB: February 13, 1996  Today's TOC FU Call Status: Today's TOC FU Call Status:: Unsuccessful Call (1st Attempt) Unsuccessful Call (1st Attempt) Date: 02/03/24  Attempted to reach the patient regarding the most recent Inpatient/ED visit.  Follow Up Plan: Additional outreach attempts will be made to reach the patient to complete the Transitions of Care (Post Inpatient/ED visit) call.   Signature  [employer], [place]

## 2024-02-18 NOTE — Telephone Encounter (Signed)
 [facility] [address], [place] [address] Phone: [phone]  Fax: [phone]   Calling in stating they do not have Prior Auth on file for medication:   Dupixent 300mg /53mL pens    Asking that we call manually and specify urgent at the contact number below  [phone]

## 2024-02-24 NOTE — Telephone Encounter (Signed)
 Medication Access Center Summary  PA Status Approved  Medication Dupixent 300mg /36ml Pens  PA Approval Dates   PA Number 02/24/2024-08/25/2024  [id number]  Pharmacy May fill at [facility]  Current Co-Pay $4.00  Provider Reche Rierson  Insurance Company [facility] Medicaid   Approval letter saved to Media tab   Nidia Cunning [facility]

## 2024-02-24 NOTE — Telephone Encounter (Signed)
 Dupixent approved. [facility] specialty pharmacy has rx on file  Note- Insurance only approved Dupixent for 6 months; through 08/25/24.  After that date, insurance will require chart notes reflecting a response for on-going coverage

## 2024-03-01 NOTE — Telephone Encounter (Signed)
 Pt called into the [facility] to report that she took loading dose Thursday evening then got a positive pregnancy test Friday morning (4/25).   Since Dupixent has limited data in pregnancy, please advise what you would like her do going forward. States she has plenty of topicals on hand and working well so far.  Dozier Cones, PharmD [facility] [phone]

## 2024-03-09 NOTE — Progress Notes (Signed)
 Specialty Pharmacy Discontinuation Note  Specialty Medication:Dupixent  Patient chart for Kimberly Odom was reviewed to confirm appropriateness of Specialty Pharmacy Services discontinuation.   Reason for Discontinuation:Patient is pregnant and has decided not to use Dupixent during her pregnancy  Therapeutic Goal Progress:Achieved goals, skin cleared up.   Future Care Coordination Needs:Patient will use topical steroids and plans to restart Dupixent post pregnancy  The patient may contact the pharmacy for any specialty medication needs questions or concerns.   [facility] Hours of Operation: Monday - Friday 8:30 am - 5:00 pm Phone: [phone]

## 2024-03-10 ENCOUNTER — Ambulatory Visit: Payer: Self-pay | Admitting: Nurse Practitioner

## 2024-03-30 LAB — OB RESULTS CONSOLE HEPATITIS B SURFACE ANTIGEN: Hepatitis B Surface Ag: NEGATIVE

## 2024-03-30 LAB — HEPATITIS C ANTIBODY: HCV Ab: NEGATIVE

## 2024-03-30 LAB — OB RESULTS CONSOLE RUBELLA ANTIBODY, IGM: Rubella: IMMUNE

## 2024-07-03 ENCOUNTER — Inpatient Hospital Stay (HOSPITAL_COMMUNITY)

## 2024-07-03 ENCOUNTER — Encounter (HOSPITAL_COMMUNITY): Payer: Self-pay | Admitting: Obstetrics & Gynecology

## 2024-07-03 ENCOUNTER — Inpatient Hospital Stay (HOSPITAL_COMMUNITY)
Admission: AD | Admit: 2024-07-03 | Discharge: 2024-07-03 | Disposition: A | Discharge status: Home / Self Care | Attending: Obstetrics & Gynecology | Admitting: Obstetrics & Gynecology

## 2024-07-03 ENCOUNTER — Other Ambulatory Visit: Payer: Self-pay

## 2024-07-03 DIAGNOSIS — B9620 Unspecified Escherichia coli [E. coli] as the cause of diseases classified elsewhere: Secondary | ICD-10-CM | POA: Diagnosis not present

## 2024-07-03 DIAGNOSIS — O99012 Anemia complicating pregnancy, second trimester: Secondary | ICD-10-CM | POA: Insufficient documentation

## 2024-07-03 DIAGNOSIS — O2302 Infections of kidney in pregnancy, second trimester: Secondary | ICD-10-CM | POA: Insufficient documentation

## 2024-07-03 DIAGNOSIS — Z87440 Personal history of urinary (tract) infections: Secondary | ICD-10-CM | POA: Diagnosis not present

## 2024-07-03 DIAGNOSIS — N136 Pyonephrosis: Secondary | ICD-10-CM | POA: Diagnosis not present

## 2024-07-03 DIAGNOSIS — Z3A23 23 weeks gestation of pregnancy: Secondary | ICD-10-CM | POA: Diagnosis not present

## 2024-07-03 DIAGNOSIS — R1011 Right upper quadrant pain: Secondary | ICD-10-CM | POA: Diagnosis present

## 2024-07-03 DIAGNOSIS — D509 Iron deficiency anemia, unspecified: Secondary | ICD-10-CM | POA: Diagnosis not present

## 2024-07-03 DIAGNOSIS — O26892 Other specified pregnancy related conditions, second trimester: Secondary | ICD-10-CM

## 2024-07-03 DIAGNOSIS — N390 Urinary tract infection, site not specified: Secondary | ICD-10-CM

## 2024-07-03 DIAGNOSIS — Z3689 Encounter for other specified antenatal screening: Secondary | ICD-10-CM

## 2024-07-03 LAB — CBC WITH DIFFERENTIAL/PLATELET
Abs Immature Granulocytes: 0.05 K/uL (ref 0.00–0.07)
Basophils Absolute: 0 K/uL (ref 0.0–0.1)
Basophils Relative: 0 %
Eosinophils Absolute: 0 K/uL (ref 0.0–0.5)
Eosinophils Relative: 0 %
HCT: 26.4 % — ABNORMAL LOW (ref 36.0–46.0)
Hemoglobin: 8.3 g/dL — ABNORMAL LOW (ref 12.0–15.0)
Immature Granulocytes: 1 %
Lymphocytes Relative: 10 %
Lymphs Abs: 1 K/uL (ref 0.7–4.0)
MCH: 22.6 pg — ABNORMAL LOW (ref 26.0–34.0)
MCHC: 31.4 g/dL (ref 30.0–36.0)
MCV: 71.7 fL — ABNORMAL LOW (ref 80.0–100.0)
Monocytes Absolute: 0.8 K/uL (ref 0.1–1.0)
Monocytes Relative: 8 %
Neutro Abs: 8.2 K/uL — ABNORMAL HIGH (ref 1.7–7.7)
Neutrophils Relative %: 81 %
Platelets: 318 K/uL (ref 150–400)
RBC: 3.68 MIL/uL — ABNORMAL LOW (ref 3.87–5.11)
RDW: 14.9 % (ref 11.5–15.5)
WBC: 10.1 K/uL (ref 4.0–10.5)
nRBC: 0 % (ref 0.0–0.2)

## 2024-07-03 LAB — URINALYSIS, ROUTINE W REFLEX MICROSCOPIC
Bilirubin Urine: NEGATIVE
Glucose, UA: NEGATIVE mg/dL
Hgb urine dipstick: NEGATIVE
Ketones, ur: 5 mg/dL — AB
Nitrite: POSITIVE — AB
Protein, ur: 100 mg/dL — AB
Specific Gravity, Urine: 1.012 (ref 1.005–1.030)
WBC, UA: >50 WBC/hpf (ref 0–5)
pH: 7 (ref 5.0–8.0)

## 2024-07-03 MED ORDER — CEFADROXIL 500 MG PO CAPS: 1000.0000 mg | ORAL_CAPSULE | Freq: Two times a day (BID) | ORAL | 0 refills | Status: AC

## 2024-07-03 MED ORDER — CEFTRIAXONE SODIUM 1 G IJ SOLR
1.0000 g | Freq: Once | INTRAMUSCULAR | Status: AC
  Administered 2024-07-03: 1 g via INTRAMUSCULAR
  Filled 2024-07-03: qty 10

## 2024-07-03 MED ORDER — LIDOCAINE HCL (PF) 1 % IJ SOLN
5.0000 mL | Freq: Once | INTRAMUSCULAR | Status: AC
  Administered 2024-07-03: 5 mL
  Filled 2024-07-03: qty 5

## 2024-07-03 NOTE — MAU Note (Signed)
 Kimberly Odom is a 28 y.o. at [redacted]w[redacted]d here in MAU reporting: right side pain. States it hurts when she does anything. Denies VB or LOF. +FM.    Onset of complaint: a few weeks ago Pain score: 6/10 Vitals:   07/03/24 1529  BP: 95/61  Pulse: 100  Resp: 18  Temp: 98.1 F (36.7 C)  SpO2: 100%     FHT: 155  Lab orders placed from triage: UA

## 2024-07-03 NOTE — MAU Provider Note (Cosign Needed Addendum)
 Chief Complaint:  Abdominal Pain  HPI   Event Date/Time   First Provider Initiated Contact with Patient 07/03/24 1548     Kimberly Odom is a 28 y.o. G2P1001 at [redacted]w[redacted]d who presents to maternity admissions reporting abdominal pain located underneath right rib. Pain present intermittently for 2-3 weeks. Worsens when lying on right side and with position changes from laying to sitting. Came into MAU today because she was concerned that this hasn't improved. Denies dysuria, lower abdominal cramping, vaginal bleeding, leaking of fluid, or abnormal vaginal discharge. Also reporting last BM 1.5 weeks ago.  Pregnancy Course: prenatal care at [facility], UTI in July (treated with 2 courses of Macrobid), iron deficiency   Past Medical History:  Diagnosis Date   Allergy    Eczema    Intrinsic atopic dermatitis    OB History  Gravida Para Term Preterm AB Living  2 1 1   1   SAB IAB Ectopic Multiple Live Births     0 1    # Outcome Date GA Lbr Len/2nd Weight Sex Type Anes PTL Lv  2 Current           1 Term 05/02/19 [redacted]w[redacted]d 09:08 / 00:40 3354 g M Vag-Spont Local  LIV   Past Surgical History:  Procedure Laterality Date   NO PAST SURGERIES     Family History  Problem Relation Age of Onset   Healthy Mother    Social History   Tobacco Use   Smoking status: Never   Smokeless tobacco: Never  Vaping Use   Vaping status: Never Used  Substance Use Topics   Alcohol use: Never   Drug use: Never   Allergies  Allergen Reactions   Shellfish Allergy Anaphylaxis   Cinnamon Itching   Peanuts [Peanut Oil]    No medications prior to admission.   I have reviewed patient's Past Medical Hx, Surgical Hx, Family Hx, Social Hx, medications and allergies.   ROS  Pertinent items noted in HPI and remainder of comprehensive ROS otherwise negative.   PHYSICAL EXAM  Patient Vitals for the past 24 hrs:  BP Temp Temp src Pulse Resp SpO2 Height Weight  07/03/24 1845 97/68 -- -- 98 -- -- -- --  07/03/24 1529 95/61  98.1 F (36.7 C) Oral 100 18 100 % -- --  07/03/24 1522 -- -- -- -- -- -- 5' 2 (1.575 m) 46.3 kg   Constitutional: Well-developed, well-nourished female in no acute distress.  Cardiovascular: normal rate & rhythm, warm and well-perfused Respiratory: normal effort, no problems with respiration noted GI: Abd soft, non-tender, non-distended MS: Extremities nontender, no edema, normal ROM Neurologic: Alert and oriented x 4.  GU: CVA tenderness Pelvic: deferred   Fetal Tracing: Reactive Baseline: 165 Variability: moderate Accelerations: 10x10 Decelerations: variable Toco: UI with UC x 1   Labs: Results for orders placed or performed during the hospital encounter of 07/03/24 (from the past 24 hours)  Urinalysis, Routine w reflex microscopic -Urine, Clean Catch     Status: Abnormal   Collection Time: 07/03/24  3:30 PM  Result Value Ref Range   Color, Urine AMBER (A) YELLOW   APPearance CLOUDY (A) CLEAR   Specific Gravity, Urine 1.012 1.005 - 1.030   pH 7.0 5.0 - 8.0   Glucose, UA NEGATIVE NEGATIVE mg/dL   Hgb urine dipstick NEGATIVE NEGATIVE   Bilirubin Urine NEGATIVE NEGATIVE   Ketones, ur 5 (A) NEGATIVE mg/dL   Protein, ur 899 (A) NEGATIVE mg/dL   Nitrite POSITIVE (A) NEGATIVE  Leukocytes,Ua LARGE (A) NEGATIVE   RBC / HPF 6-10 0 - 5 RBC/hpf   WBC, UA >50 0 - 5 WBC/hpf   Bacteria, UA MANY (A) NONE SEEN   Squamous Epithelial / HPF 21-50 0 - 5 /HPF   WBC Clumps PRESENT    Mucus PRESENT   CBC with Differential/Platelet     Status: Abnormal   Collection Time: 07/03/24  4:26 PM  Result Value Ref Range   WBC 10.1 4.0 - 10.5 K/uL   RBC 3.68 (L) 3.87 - 5.11 MIL/uL   Hemoglobin 8.3 (L) 12.0 - 15.0 g/dL   HCT 73.5 (L) 63.9 - 53.9 %   MCV 71.7 (L) 80.0 - 100.0 fL   MCH 22.6 (L) 26.0 - 34.0 pg   MCHC 31.4 30.0 - 36.0 g/dL   RDW 85.0 88.4 - 84.4 %   Platelets 318 150 - 400 K/uL   nRBC 0.0 0.0 - 0.2 %   Neutrophils Relative % 81 %   Neutro Abs 8.2 (H) 1.7 - 7.7 K/uL    Lymphocytes Relative 10 %   Lymphs Abs 1.0 0.7 - 4.0 K/uL   Monocytes Relative 8 %   Monocytes Absolute 0.8 0.1 - 1.0 K/uL   Eosinophils Relative 0 %   Eosinophils Absolute 0.0 0.0 - 0.5 K/uL   Basophils Relative 0 %   Basophils Absolute 0.0 0.0 - 0.1 K/uL   Immature Granulocytes 1 %   Abs Immature Granulocytes 0.05 0.00 - 0.07 K/uL   Imaging:  US  RENAL Result Date: 07/03/2024 CLINICAL DATA:  Back pain EXAM: RENAL / URINARY TRACT ULTRASOUND COMPLETE COMPARISON:  None Available. FINDINGS: Right Kidney: Renal measurements: 11.1 x 4.7 x 5.4 cm = volume: 145 mL. Echogenicity is within normal limits. There is moderate hydronephrosis. No focal lesion. Left Kidney: Renal measurements: 10.9 x 5.3 x 5.3 cm = volume: 161 mL. Echogenicity within normal limits. No mass or hydronephrosis visualized. Bladder: Under distended and not well evaluated. Other: None. IMPRESSION: Moderate right hydronephrosis. Electronically Signed   By: Greig Pique M.D.   On: 07/03/2024 17:35   MDM & MAU COURSE  MDM: Moderate  MAU Course: Orders Placed This Encounter  Procedures   Culture, OB Urine   US  RENAL   Urinalysis, Routine w reflex microscopic -Urine, Clean Catch   CBC with Differential/Platelet   Discharge patient   Meds ordered this encounter  Medications   cefTRIAXone  (ROCEPHIN ) injection 1 g    Antibiotic Indication::   UTI   lidocaine  (PF) (XYLOCAINE ) 1 % injection 5 mL   cefadroxil  (DURICEF) 500 MG capsule    Sig: Take 2 capsules (1,000 mg total) by mouth 2 (two) times daily for 10 days.    Dispense:  40 capsule    Refill:  0   Severe CVA tenderness on exam, UA positive for nitrites & bacteria, renal US  shows hydronephrosis of right kidney. Afebrile with WBC 10.1. Recommended treatment of complicated UTI, offered IV vs IM Rocephin ; patient would prefer IM. Will discharge with Duricef 1000 mg BID x 10 days.   Hgb 8.3 today. Previously seen iron deficiency with normal Hgb, taking ferrous sulfate   daily. Offered referral for IV iron. Patient would prefer to follow up with OB provider.  Last bowel movement 1.5 weeks ago. Recommended PRN Miralax-- plan to start with BID until bowel movements are regular.  ASSESSMENT   1. Complicated UTI (urinary tract infection)   2. [redacted] weeks gestation of pregnancy   3. NST (non-stress test) reactive  PLAN  Discharge home in stable condition with return precautions of fever, worsening pain, or N/V.    Follow-up Information     [facility] Follow up.   Why: as scheduled for ongoing prenatal care Contact information: [address] [place]  [address] [phone]                Allergies as of 07/03/2024       Reactions   Shellfish Allergy Anaphylaxis   Cinnamon Itching   Peanuts [peanut Oil]         Medication List     STOP taking these medications    Dupixent 300 MG/2ML Soaj Generic drug: Dupilumab   ibuprofen  800 MG tablet Commonly known as: ADVIL        TAKE these medications    cefadroxil  500 MG capsule Commonly known as: DURICEF Take 2 capsules (1,000 mg total) by mouth 2 (two) times daily for 10 days.   ferrous sulfate  325 (65 FE) MG tablet Take 1 tablet (325 mg total) by mouth 2 (two) times daily with a meal.   prenatal multivitamin Tabs tablet Take 1 tablet by mouth daily at 12 noon.   Vitamin D  (Ergocalciferol ) 1.25 MG (50000 UNIT) Caps capsule Commonly known as: DRISDOL  Take 1 capsule (50,000 Units total) by mouth every 7 (seven) days.       Vernell Ruddle, SNM 07/03/24 TRENNA   Attestation of Supervision of Student:  I confirm that I have verified the information documented in the nurse midwife student's note and that I have also personally supervised the history, physical exam and all medical decision making activities.  I have verified that all services and findings are accurately documented in this student's note; and I agree with  management and plan as outlined in the documentation. I have also made any necessary editorial changes.  Cornell JONELLE Finder, CNM [facility] [employer], [facility] 07/03/2024 9:53 PM

## 2024-07-06 LAB — CULTURE, OB URINE: Culture: >=100000 — AB

## 2024-08-05 LAB — OB RESULTS CONSOLE HIV ANTIBODY (ROUTINE TESTING): HIV: NONREACTIVE

## 2024-08-05 LAB — OB RESULTS CONSOLE RPR: RPR: NONREACTIVE

## 2024-10-07 LAB — OB RESULTS CONSOLE GBS: GBS: NEGATIVE

## 2024-10-21 ENCOUNTER — Encounter (HOSPITAL_COMMUNITY): Payer: Self-pay | Admitting: Obstetrics and Gynecology

## 2024-10-21 ENCOUNTER — Inpatient Hospital Stay (HOSPITAL_COMMUNITY): Admitting: Anesthesiology

## 2024-10-21 ENCOUNTER — Other Ambulatory Visit: Payer: Self-pay

## 2024-10-21 ENCOUNTER — Inpatient Hospital Stay (HOSPITAL_COMMUNITY)
Admission: AD | Admit: 2024-10-21 | Discharge: 2024-10-22 | DRG: 807 | Disposition: A | Discharge status: Home / Self Care | Attending: Obstetrics and Gynecology | Admitting: Obstetrics and Gynecology

## 2024-10-21 DIAGNOSIS — Z3A39 39 weeks gestation of pregnancy: Secondary | ICD-10-CM

## 2024-10-21 DIAGNOSIS — O9902 Anemia complicating childbirth: Secondary | ICD-10-CM | POA: Diagnosis present

## 2024-10-21 DIAGNOSIS — K219 Gastro-esophageal reflux disease without esophagitis: Secondary | ICD-10-CM | POA: Diagnosis present

## 2024-10-21 DIAGNOSIS — O26893 Other specified pregnancy related conditions, third trimester: Secondary | ICD-10-CM | POA: Diagnosis present

## 2024-10-21 DIAGNOSIS — O9962 Diseases of the digestive system complicating childbirth: Secondary | ICD-10-CM | POA: Diagnosis present

## 2024-10-21 DIAGNOSIS — O43193 Other malformation of placenta, third trimester: Principal | ICD-10-CM | POA: Diagnosis present

## 2024-10-21 LAB — CBC
HCT: 37 % (ref 36.0–46.0)
Hemoglobin: 12 g/dL (ref 12.0–15.0)
MCH: 25.2 pg — ABNORMAL LOW (ref 26.0–34.0)
MCHC: 32.4 g/dL (ref 30.0–36.0)
MCV: 77.7 fL — ABNORMAL LOW (ref 80.0–100.0)
Platelets: 137 K/uL — ABNORMAL LOW (ref 150–400)
RBC: 4.76 MIL/uL (ref 3.87–5.11)
RDW: 15.7 % — ABNORMAL HIGH (ref 11.5–15.5)
WBC: 8.9 K/uL (ref 4.0–10.5)
nRBC: 0 % (ref 0.0–0.2)

## 2024-10-21 LAB — TYPE AND SCREEN
ABO/RH(D): O POS
Antibody Screen: NEGATIVE

## 2024-10-21 LAB — SYPHILIS: RPR W/REFLEX TO RPR TITER AND TREPONEMAL ANTIBODIES, TRADITIONAL SCREENING AND DIAGNOSIS ALGORITHM: RPR Ser Ql: NONREACTIVE

## 2024-10-21 MED ORDER — DIPHENHYDRAMINE HCL 25 MG PO CAPS: 25.0000 mg | ORAL_CAPSULE | Freq: Four times a day (QID) | ORAL | Status: DC | PRN

## 2024-10-21 MED ORDER — OXYCODONE-ACETAMINOPHEN 5-325 MG PO TABS: 2.0000 | ORAL_TABLET | ORAL | Status: DC | PRN

## 2024-10-21 MED ORDER — PHENYLEPHRINE 80 MCG/ML (10ML) SYRINGE FOR IV PUSH (FOR BLOOD PRESSURE SUPPORT)
80.0000 ug | PREFILLED_SYRINGE | INTRAVENOUS | Status: DC | PRN
  Filled 2024-10-21: qty 10

## 2024-10-21 MED ORDER — OXYTOCIN BOLUS FROM INFUSION
333.0000 mL | Freq: Once | INTRAVENOUS | Status: AC
  Administered 2024-10-21: 15:00:00 333 mL via INTRAVENOUS

## 2024-10-21 MED ORDER — TETANUS-DIPHTH-ACELL PERTUSSIS 5-2-15.5 LF-MCG/0.5 IM SUSP: 0.5000 mL | Freq: Once | INTRAMUSCULAR | Status: DC

## 2024-10-21 MED ORDER — OXYTOCIN-SODIUM CHLORIDE 30-0.9 UT/500ML-% IV SOLN
1.0000 m[IU]/min | INTRAVENOUS | Status: DC
  Administered 2024-10-21: 09:00:00 2 m[IU]/min via INTRAVENOUS

## 2024-10-21 MED ORDER — ONDANSETRON HCL 4 MG PO TABS: 4.0000 mg | ORAL_TABLET | ORAL | Status: DC | PRN

## 2024-10-21 MED ORDER — ONDANSETRON HCL 4 MG/2ML IJ SOLN: 4.0000 mg | INTRAMUSCULAR | Status: DC | PRN

## 2024-10-21 MED ORDER — DIBUCAINE (PERIANAL) 1 % EX OINT: 1.0000 | TOPICAL_OINTMENT | CUTANEOUS | Status: DC | PRN

## 2024-10-21 MED ORDER — FERROUS SULFATE 325 (65 FE) MG PO TABS
325.0000 mg | ORAL_TABLET | Freq: Two times a day (BID) | ORAL | Status: DC
  Administered 2024-10-22 (×2): 325 mg via ORAL
  Filled 2024-10-21 (×2): qty 1

## 2024-10-21 MED ORDER — ONDANSETRON HCL 4 MG/2ML IJ SOLN
4.0000 mg | Freq: Four times a day (QID) | INTRAMUSCULAR | Status: DC | PRN
  Administered 2024-10-21: 12:00:00 4 mg via INTRAVENOUS
  Filled 2024-10-21: qty 2

## 2024-10-21 MED ORDER — OXYTOCIN-SODIUM CHLORIDE 30-0.9 UT/500ML-% IV SOLN
2.5000 [IU]/h | INTRAVENOUS | Status: DC
  Filled 2024-10-21: qty 500

## 2024-10-21 MED ORDER — BENZOCAINE-MENTHOL 20-0.5 % EX AERO: 1.0000 | INHALATION_SPRAY | CUTANEOUS | Status: DC | PRN

## 2024-10-21 MED ORDER — LACTATED RINGERS IV SOLN
500.0000 mL | INTRAVENOUS | Status: DC | PRN
  Administered 2024-10-21: 12:00:00 1000 mL via INTRAVENOUS

## 2024-10-21 MED ORDER — EPHEDRINE 5 MG/ML INJ
10.0000 mg | INTRAVENOUS | Status: DC | PRN
  Filled 2024-10-21: qty 5

## 2024-10-21 MED ORDER — METHYLERGONOVINE MALEATE 0.2 MG PO TABS: 0.2000 mg | ORAL_TABLET | ORAL | Status: DC | PRN

## 2024-10-21 MED ORDER — WITCH HAZEL-GLYCERIN EX PADS: 1.0000 | MEDICATED_PAD | CUTANEOUS | Status: DC | PRN

## 2024-10-21 MED ORDER — TRANEXAMIC ACID-NACL 1000-0.7 MG/100ML-% IV SOLN
INTRAVENOUS | Status: AC
  Filled 2024-10-21: qty 100

## 2024-10-21 MED ORDER — TRANEXAMIC ACID-NACL 1000-0.7 MG/100ML-% IV SOLN: 1000.0000 mg | Freq: Once | INTRAVENOUS | Status: DC

## 2024-10-21 MED ORDER — ZOLPIDEM TARTRATE 5 MG PO TABS: 5.0000 mg | ORAL_TABLET | Freq: Every evening | ORAL | Status: DC | PRN

## 2024-10-21 MED ORDER — PRENATAL MULTIVITAMIN CH
1.0000 | ORAL_TABLET | Freq: Every day | ORAL | Status: DC
  Administered 2024-10-22: 1 via ORAL
  Filled 2024-10-21: qty 1

## 2024-10-21 MED ORDER — LIDOCAINE HCL (PF) 1 % IJ SOLN
INTRAMUSCULAR | Status: DC | PRN
  Administered 2024-10-21 (×2): 4 mL via EPIDURAL

## 2024-10-21 MED ORDER — TERBUTALINE SULFATE 1 MG/ML IJ SOLN: 0.2500 mg | Freq: Once | INTRAMUSCULAR | Status: DC | PRN

## 2024-10-21 MED ORDER — OXYCODONE-ACETAMINOPHEN 5-325 MG PO TABS: 1.0000 | ORAL_TABLET | ORAL | Status: DC | PRN

## 2024-10-21 MED ORDER — FLEET ENEMA RE ENEM: 1.0000 | ENEMA | Freq: Once | RECTAL | Status: DC

## 2024-10-21 MED ORDER — PHENYLEPHRINE 80 MCG/ML (10ML) SYRINGE FOR IV PUSH (FOR BLOOD PRESSURE SUPPORT): 80.0000 ug | PREFILLED_SYRINGE | INTRAVENOUS | Status: DC | PRN

## 2024-10-21 MED ORDER — SENNOSIDES-DOCUSATE SODIUM 8.6-50 MG PO TABS
2.0000 | ORAL_TABLET | ORAL | Status: DC
  Administered 2024-10-22: 2 via ORAL
  Filled 2024-10-21: qty 2

## 2024-10-21 MED ORDER — COCONUT OIL OIL: 1.0000 | TOPICAL_OIL | Status: DC | PRN

## 2024-10-21 MED ORDER — ACETAMINOPHEN 325 MG PO TABS
650.0000 mg | ORAL_TABLET | Freq: Four times a day (QID) | ORAL | Status: DC
  Administered 2024-10-21 – 2024-10-22 (×5): 650 mg via ORAL
  Filled 2024-10-21 (×5): qty 2

## 2024-10-21 MED ORDER — LIDOCAINE HCL (PF) 1 % IJ SOLN: 30.0000 mL | INTRAMUSCULAR | Status: DC | PRN

## 2024-10-21 MED ORDER — FENTANYL-BUPIVACAINE-NACL 0.5-0.125-0.9 MG/250ML-% EP SOLN
12.0000 mL/h | EPIDURAL | Status: DC | PRN
  Administered 2024-10-21: 12:00:00 11 mL/h via EPIDURAL
  Filled 2024-10-21: qty 250

## 2024-10-21 MED ORDER — EPHEDRINE 5 MG/ML INJ: 10.0000 mg | INTRAVENOUS | Status: DC | PRN

## 2024-10-21 MED ORDER — TRANEXAMIC ACID-NACL 1000-0.7 MG/100ML-% IV SOLN
1000.0000 mg | INTRAVENOUS | Status: AC
  Administered 2024-10-21: 16:00:00 1000 mg via INTRAVENOUS

## 2024-10-21 MED ORDER — SIMETHICONE 80 MG PO CHEW: 80.0000 mg | CHEWABLE_TABLET | ORAL | Status: DC | PRN

## 2024-10-21 MED ORDER — SOD CITRATE-CITRIC ACID 500-334 MG/5ML PO SOLN: 30.0000 mL | ORAL | Status: DC | PRN

## 2024-10-21 MED ORDER — LACTATED RINGERS IV SOLN: INTRAVENOUS | Status: DC

## 2024-10-21 MED ORDER — IBUPROFEN 600 MG PO TABS
600.0000 mg | ORAL_TABLET | Freq: Four times a day (QID) | ORAL | Status: DC
  Administered 2024-10-21 – 2024-10-22 (×5): 600 mg via ORAL
  Filled 2024-10-21 (×5): qty 1

## 2024-10-21 MED ORDER — LACTATED RINGERS IV SOLN: 500.0000 mL | Freq: Once | INTRAVENOUS | Status: DC

## 2024-10-21 MED ORDER — DIPHENHYDRAMINE HCL 50 MG/ML IJ SOLN: 12.5000 mg | INTRAMUSCULAR | Status: DC | PRN

## 2024-10-21 MED ORDER — ACETAMINOPHEN 325 MG PO TABS: 650.0000 mg | ORAL_TABLET | ORAL | Status: DC | PRN

## 2024-10-21 NOTE — MAU Note (Signed)
 Kimberly Odom is a 28 y.o. at [redacted]w[redacted]d here in MAU reporting: LOF after a large gush of fluid about an hour ago. No VB and reports +FM.   LMP:  Onset of complaint: 0200 Pain score:   FHT: Pt by-passed triage and placed straight in MAU exam room   Lab orders placed from triage: labor eval

## 2024-10-21 NOTE — Anesthesia Procedure Notes (Signed)
 Epidural Patient location during procedure: OB Start time: 10/21/2024 12:06 PM End time: 10/21/2024 12:14 PM  Staffing Anesthesiologist: Jerrye Sharper, MD Performed: anesthesiologist   Preanesthetic Checklist Completed: patient identified, IV checked, site marked, risks and benefits discussed, surgical consent, monitors and equipment checked, pre-op evaluation and timeout performed  Epidural Patient position: sitting Prep: DuraPrep and site prepped and draped Patient monitoring: continuous pulse ox and blood pressure Approach: midline Location: L3-L4 Injection technique: LOR air  Needle:  Needle type: Tuohy  Needle gauge: 17 G Needle length: 9 cm and 9 Needle insertion depth: 4 cm Catheter type: closed end flexible Catheter size: 19 Gauge Catheter at skin depth: 9 cm Test dose: negative and Other  Assessment Events: blood not aspirated, no cerebrospinal fluid, injection not painful, no injection resistance, no paresthesia and negative IV test  Additional Notes Patient identified. Risks and benefits discussed including failed block, incomplete  Pain control, post dural puncture headache, nerve damage, paralysis, blood pressure Changes, nausea, vomiting, reactions to medications-both toxic and allergic and post Partum back pain. All questions were answered. Patient expressed understanding and wished to proceed. Sterile technique was used throughout procedure. Epidural site was Dressed with sterile barrier dressing. No paresthesias, signs of intravascular injection Or signs of intrathecal spread were encountered.  Patient was more comfortable after the epidural was dosed. Please see RN's note for documentation of vital signs and FHR which are stable. Reason for block:procedure for pain

## 2024-10-21 NOTE — Lactation Note (Signed)
 This note was copied from a baby's chart. Lactation Consultation Note  Patient Name: Kimberly Odom Date: 10/21/2024 Age:28 hours   Mom chooses to formula feed.  Maternal Data    Feeding    LATCH Score                    Lactation Tools Discussed/Used    Interventions    Discharge    Consult Status Consult Status: Complete    [name] G 10/21/2024, 9:33 PM

## 2024-10-21 NOTE — Progress Notes (Signed)
 Subjective:    Comfortable with the epidural.   Objective:    VS: BP 111/74   Pulse (!) 59   Temp 98 F (36.7 C) (Oral)   Resp 16   Ht 5' 2 (1.575 m)   Wt 46.3 kg   LMP 01/19/2024 (Exact Date)   SpO2 98%   BMI 18.67 kg/m  FHR: baseline 145 / variability minimal to moderate / accelerations absent / early decelerations Toco: contractions every 2-3 minutes  Membranes: SROM x 13 hrs Dilation: 6.5 Effacement (%): 90 Cervical Position: Posterior Station: -1 Presentation: Vertex Exam by:: S Earl RNC Pitocin  6 mU/min  Assessment/Plan:   28 y.o. G2P1001 [redacted]w[redacted]d  Labor: Progressing normally Fetal Wellbeing:  Category I Pain Control:  Epidural I/D:  GBS neg Anticipated MOD:  NSVD  [name] DNP, CNM 10/21/2024 1:16 PM
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
## 2024-10-21 NOTE — Anesthesia Preprocedure Evaluation (Signed)
 Anesthesia Evaluation  Patient identified by MRN, date of birth, ID band Patient awake    Reviewed: Allergy & Precautions, NPO status , Patient's Chart, lab work & pertinent test results  Airway Mallampati: II       Dental no notable dental hx. (+) Teeth Intact   Pulmonary neg pulmonary ROS   Pulmonary exam normal breath sounds clear to auscultation       Cardiovascular negative cardio ROS Normal cardiovascular exam Rhythm:Regular Rate:Normal     Neuro/Psych negative neurological ROS  negative psych ROS   GI/Hepatic Neg liver ROS,GERD  ,,  Endo/Other  negative endocrine ROS    Renal/GU negative Renal ROS  negative genitourinary   Musculoskeletal negative musculoskeletal ROS (+)    Abdominal   Peds  Hematology  (+) Blood dyscrasia, anemia Thrombocytopenia Lab Results      Component                Value               Date                      WBC                      8.9                 10/21/2024                HGB                      12.0                10/21/2024                HCT                      37.0                10/21/2024                MCV                      77.7 (L)            10/21/2024                PLT                      137 (L)             10/21/2024              Anesthesia Other Findings   Reproductive/Obstetrics (+) Pregnancy                              Anesthesia Physical Anesthesia Plan  ASA: 2  Anesthesia Plan: Epidural   Post-op Pain Management: Minimal or no pain anticipated   Induction:   PONV Risk Score and Plan: Treatment may vary due to age or medical condition  Airway Management Planned: Natural Airway  Additional Equipment: Fetal Monitoring and None  Intra-op Plan:   Post-operative Plan:   Informed Consent: I have reviewed the patients History and Physical, chart, labs and discussed the procedure including the risks, benefits and  alternatives for the proposed anesthesia with the patient or authorized representative who has indicated his/her understanding  and acceptance.       Plan Discussed with: Anesthesiologist  Anesthesia Plan Comments:          Anesthesia Quick Evaluation

## 2024-10-22 LAB — CBC
HCT: 28.4 % — ABNORMAL LOW (ref 36.0–46.0)
Hemoglobin: 8.9 g/dL — ABNORMAL LOW (ref 12.0–15.0)
MCH: 24.3 pg — ABNORMAL LOW (ref 26.0–34.0)
MCHC: 31.3 g/dL (ref 30.0–36.0)
MCV: 77.6 fL — ABNORMAL LOW (ref 80.0–100.0)
Platelets: 117 K/uL — ABNORMAL LOW (ref 150–400)
RBC: 3.66 MIL/uL — ABNORMAL LOW (ref 3.87–5.11)
RDW: 15.3 % (ref 11.5–15.5)
WBC: 10.8 K/uL — ABNORMAL HIGH (ref 4.0–10.5)
nRBC: 0 % (ref 0.0–0.2)

## 2024-10-22 MED ORDER — IBUPROFEN 600 MG PO TABS: 600.0000 mg | ORAL_TABLET | Freq: Four times a day (QID) | ORAL | 0 refills | Status: AC

## 2024-10-22 MED ORDER — ACETAMINOPHEN 325 MG PO TABS: 650.0000 mg | ORAL_TABLET | Freq: Four times a day (QID) | ORAL | 0 refills | Status: AC | PRN

## 2024-10-22 NOTE — Discharge Summary (Signed)
 "     Postpartum Discharge Summary  Date of Service updated 10/22/24     Patient Name: Kimberly Odom DOB: Oct 08, 1996 MRN: [id number]  Date of admission: 10/21/2024 Delivery date:10/21/2024 Delivering provider: ARNELL SILK B Date of discharge: 10/22/2024  Admitting diagnosis: Normal labor [O80, Z37.9] Intrauterine pregnancy: [redacted]w[redacted]d     Secondary diagnosis:  Principal Problem:   Postpartum care following vaginal delivery 12/18 Active Problems:   Normal labor  Additional problems: none    Discharge diagnosis: Term Pregnancy Delivered                                              Post partum procedures:none Augmentation: Pitocin  Complications: None  Hospital course: Onset of Labor With Vaginal Delivery      28 y.o. yo [id number] at [redacted]w[redacted]d was admitted in Active Labor on 10/21/2024. Labor course was uncomplicated. Membrane Rupture Time/Date: 2:00 AM,10/21/2024  Delivery Method:Vaginal, Spontaneous Operative Delivery:N/A Episiotomy: None Lacerations:  None Patient had an uncomplicated postpartum course.  She is ambulating, tolerating a regular diet, passing flatus, and urinating well. Patient is discharged home in stable condition on 10/22/2024.  Newborn Data: Birth date:10/21/2024 Birth time:3:09 PM Gender:Female Living status:Living Apgars:10 ,9  Weight:3050 g  Magnesium Sulfate received: No BMZ received: No Rhophylac:N/A MMR:N/A T-DaP:declined Flu: declined RSV Vaccine received: No Transfusion:No Immunizations administered: Immunization History  Administered Date(s) Administered   DTaP 12/10/1996, 03/21/1997, 05/27/1997, 01/11/1998, 08/26/2002   HIB (PRP-OMP) 12/10/1996, 03/21/1997, 05/27/1997, 01/11/1998   HPV Quadrivalent 06/05/2009, 05/15/2012, 11/12/2013   Hepatitis A 05/16/2006, 06/05/2009   Hepatitis B 12/10/1996, 05/27/1997, 01/11/1998   IPV 12/10/1996, 03/21/1997, 11/14/1997, 08/26/2002   Influenza Nasal 08/20/2012, 11/12/2013   Influenza Split 09/05/2011    Influenza,Quad,Nasal, Live 11/12/2013   Influenza,inj,Quad PF,6+ Mos 09/01/2015   Influenza-Unspecified 10/27/2003   MMR 11/14/1997, 08/26/2002   Meningococcal Conjugate 06/05/2009   Tdap 06/05/2009, 05/03/2019   Varicella 11/14/1997, 05/16/2006    Physical exam  Vitals:   10/21/24 1815 10/21/24 2220 10/22/24 0215 10/22/24 0637  BP: 99/72 96/69 111/77 100/68  Pulse: (!) 57 72 62 60  Resp: 18 18 18 18   Temp: 98.2 F (36.8 C) 98.2 F (36.8 C) 98 F (36.7 C) 98.4 F (36.9 C)  TempSrc: Oral Oral Oral Oral  SpO2: 100% 100% 100% 98%  Weight:      Height:       General: alert Lochia: appropriate Uterine Fundus: firm Incision: N/A DVT Evaluation: No evidence of DVT seen on physical exam. Labs: Lab Results  Component Value Date   WBC 10.8 (H) 10/22/2024   HGB 8.9 (L) 10/22/2024   HCT 28.4 (L) 10/22/2024   MCV 77.6 (L) 10/22/2024   PLT 117 (L) 10/22/2024      Latest Ref Rng & Units 03/12/2023   10:00 AM  CMP  Glucose 70 - 99 mg/dL 81   BUN 6 - 20 mg/dL 10   Creatinine 9.42 - 1.00 mg/dL 9.38   Sodium 865 - 855 mmol/L 139   Potassium 3.5 - 5.2 mmol/L 4.1   Chloride 96 - 106 mmol/L 107   CO2 20 - 29 mmol/L 17   Calcium 8.7 - 10.2 mg/dL 8.8   Total Protein 6.0 - 8.5 g/dL 6.8   Total Bilirubin 0.0 - 1.2 mg/dL 0.4   Alkaline Phos 44 - 121 IU/L 53   AST 0 - 40 IU/L 17   ALT  0 - 32 IU/L 9    Edinburgh Score:    10/22/2024    9:01 AM  Edinburgh Postnatal Depression Scale Screening Tool  I have been able to laugh and see the funny side of things. 0  I have looked forward with enjoyment to things. 0  I have blamed myself unnecessarily when things went wrong. 0  I have been anxious or worried for no good reason. 0  I have felt scared or panicky for no good reason. 0  Things have been getting on top of me. 0  I have been so unhappy that I have had difficulty sleeping. 0  I have felt sad or miserable. 0  I have been so unhappy that I have been crying. 0  The thought of  harming myself has occurred to me. 0  Edinburgh Postnatal Depression Scale Total 0      After visit meds:  Allergies as of 10/22/2024       Reactions   Shellfish Allergy Anaphylaxis   Cinnamon Itching   Peanuts [peanut Oil]    Pt states I don't know        Medication List     TAKE these medications    acetaminophen  325 MG tablet Commonly known as: Tylenol  Take 2 tablets (650 mg total) by mouth every 6 (six) hours as needed for moderate pain (pain score 4-6).   ferrous sulfate  325 (65 FE) MG tablet Take 1 tablet (325 mg total) by mouth 2 (two) times daily with a meal.   ibuprofen  600 MG tablet Commonly known as: ADVIL  Take 1 tablet (600 mg total) by mouth every 6 (six) hours.   prenatal multivitamin Tabs tablet Take 1 tablet by mouth daily at 12 noon.   Vitamin D  (Ergocalciferol ) 1.25 MG (50000 UNIT) Caps capsule Commonly known as: DRISDOL  Take 1 capsule (50,000 Units total) by mouth every 7 (seven) days.         Discharge home in stable condition Infant Feeding: Bottle Infant Disposition:home with mother Discharge instruction: per After Visit Summary and Postpartum booklet. Activity: Advance as tolerated. Pelvic rest for 6 weeks.  Diet: routine diet Anticipated Birth Control: Unsure Postpartum Appointment:6 weeks Additional Postpartum F/U: none Future Appointments:No future appointments. Follow up Visit:  Follow-up Information     [facility] Follow up in 6 week(s).   Specialty: Obstetrics and Gynecology Why: PP visit Contact information: [address] [place]  [address] [phone]                    10/22/2024 Emmie [employer], MD   "

## 2024-10-22 NOTE — Anesthesia Postprocedure Evaluation (Signed)
"   Anesthesia Post Note  Patient: Kimberly Odom  Procedure(s) Performed: AN AD HOC LABOR EPIDURAL     Patient location during evaluation: Mother Baby Anesthesia Type: Epidural Level of consciousness: awake and alert Pain management: pain level controlled Vital Signs Assessment: post-procedure vital signs reviewed and stable Respiratory status: spontaneous breathing, nonlabored ventilation and respiratory function stable Cardiovascular status: stable Postop Assessment: no headache, no backache, epidural receding, no apparent nausea or vomiting, patient able to bend at knees, able to ambulate and adequate PO intake Anesthetic complications: no   No notable events documented.  Last Vitals:  Vitals:   10/22/24 0215 10/22/24 0637  BP: 111/77 100/68  Pulse: 62 60  Resp: 18 18  Temp: 36.7 C 36.9 C  SpO2: 100% 98%    Last Pain:  Vitals:   10/22/24 0638  TempSrc:   PainSc: 0-No pain   Pain Goal: Patients Stated Pain Goal: 0 (10/21/24 1821)                 Alaisha Eversley Hristova      "

## 2024-10-26 LAB — SURGICAL PATHOLOGY

## 2024-10-30 ENCOUNTER — Telehealth (HOSPITAL_COMMUNITY): Payer: Self-pay

## 2024-10-30 NOTE — Telephone Encounter (Signed)
 10/30/2024 1051  Name: [name] MRN: [id number] DOB: April 09, 1996  Reason for Call:  Transition of Care Hospital Discharge Call  Contact Status: Patient Contact Status: Unable to contact No voicemail option  Language assistant needed:          Follow-Up Questions:    Van Postnatal Depression Scale:  In the Past 7 Days:    PHQ2-9 Depression Scale:     Discharge Follow-up:    Post-discharge interventions: NA  Signature  Rosaline Deretha PEAK
